# Patient Record
Sex: Male | Born: 1995 | Race: Black or African American | Hispanic: No | Marital: Single | Smoking: Never smoker
Health system: Southern US, Community
[De-identification: ages and names within clinical notes are randomized; demographics above are authoritative.]

## PROBLEM LIST (undated history)

## (undated) DIAGNOSIS — K219 Gastro-esophageal reflux disease without esophagitis: Secondary | ICD-10-CM

## (undated) DIAGNOSIS — J45909 Unspecified asthma, uncomplicated: Secondary | ICD-10-CM

## (undated) DIAGNOSIS — J302 Other seasonal allergic rhinitis: Secondary | ICD-10-CM

## (undated) DIAGNOSIS — K589 Irritable bowel syndrome without diarrhea: Secondary | ICD-10-CM

## (undated) DIAGNOSIS — R197 Diarrhea, unspecified: Secondary | ICD-10-CM

## (undated) DIAGNOSIS — R109 Unspecified abdominal pain: Secondary | ICD-10-CM

## (undated) DIAGNOSIS — R110 Nausea: Secondary | ICD-10-CM

## (undated) DIAGNOSIS — A045 Campylobacter enteritis: Secondary | ICD-10-CM

## (undated) HISTORY — PX: CHOLECYSTECTOMY: SHX55

## (undated) HISTORY — PX: COLONOSCOPY: SHX174

## (undated) HISTORY — DX: Unspecified asthma, uncomplicated: J45.909

## (undated) HISTORY — PX: EYE SURGERY: SHX253

## (undated) HISTORY — DX: Diarrhea, unspecified: R19.7

## (undated) HISTORY — DX: Other seasonal allergic rhinitis: J30.2

## (undated) HISTORY — DX: Irritable bowel syndrome without diarrhea: K58.9

## (undated) HISTORY — DX: Campylobacter enteritis: A04.5

## (undated) HISTORY — PX: TONSILLECTOMY: SUR1361

## (undated) HISTORY — DX: Unspecified abdominal pain: R10.9

## (undated) HISTORY — DX: Gastro-esophageal reflux disease without esophagitis: K21.9

## (undated) HISTORY — DX: Nausea: R11.0

---
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
## 2015-01-14 ENCOUNTER — Telehealth: Payer: Self-pay | Admitting: *Deleted

## 2015-01-14 ENCOUNTER — Ambulatory Visit (INDEPENDENT_AMBULATORY_CARE_PROVIDER_SITE_OTHER): Payer: Medicaid Other | Admitting: Infectious Disease

## 2015-01-14 ENCOUNTER — Encounter: Payer: Self-pay | Admitting: Infectious Disease

## 2015-01-14 VITALS — BP 125/76 | HR 90 | Temp 100.0°F | Ht 68.0 in | Wt 312.0 lb

## 2015-01-14 DIAGNOSIS — A09 Infectious gastroenteritis and colitis, unspecified: Secondary | ICD-10-CM

## 2015-01-14 DIAGNOSIS — A045 Campylobacter enteritis: Secondary | ICD-10-CM | POA: Diagnosis not present

## 2015-01-14 DIAGNOSIS — R109 Unspecified abdominal pain: Secondary | ICD-10-CM

## 2015-01-14 DIAGNOSIS — R110 Nausea: Secondary | ICD-10-CM | POA: Diagnosis not present

## 2015-01-14 DIAGNOSIS — J302 Other seasonal allergic rhinitis: Secondary | ICD-10-CM | POA: Insufficient documentation

## 2015-01-14 DIAGNOSIS — R197 Diarrhea, unspecified: Secondary | ICD-10-CM

## 2015-01-14 HISTORY — DX: Diarrhea, unspecified: R19.7

## 2015-01-14 HISTORY — DX: Nausea: R11.0

## 2015-01-14 HISTORY — DX: Campylobacter enteritis: A04.5

## 2015-01-14 HISTORY — DX: Unspecified abdominal pain: R10.9

## 2015-01-14 LAB — CBC WITH DIFFERENTIAL/PLATELET
BASOS ABS: 0.1 10*3/uL (ref 0.0–0.1)
BASOS PCT: 1 % (ref 0–1)
EOS ABS: 0.1 10*3/uL (ref 0.0–0.7)
EOS PCT: 1 % (ref 0–5)
HCT: 40.1 % (ref 39.0–52.0)
Hemoglobin: 13.4 g/dL (ref 13.0–17.0)
LYMPHS ABS: 2.1 10*3/uL (ref 0.7–4.0)
LYMPHS PCT: 31 % (ref 12–46)
MCH: 24.1 pg — AB (ref 26.0–34.0)
MCHC: 33.4 g/dL (ref 30.0–36.0)
MCV: 72 fL — AB (ref 78.0–100.0)
MPV: 10.4 fL (ref 8.6–12.4)
Monocytes Absolute: 0.7 10*3/uL (ref 0.1–1.0)
Monocytes Relative: 10 % (ref 3–12)
NEUTROS ABS: 3.9 10*3/uL (ref 1.7–7.7)
NEUTROS PCT: 57 % (ref 43–77)
PLATELETS: 349 10*3/uL (ref 150–400)
RBC: 5.57 MIL/uL (ref 4.22–5.81)
RDW: 14.8 % (ref 11.5–15.5)
WBC: 6.9 10*3/uL (ref 4.0–10.5)

## 2015-01-14 LAB — COMPLETE METABOLIC PANEL WITH GFR
ALT: 21 U/L (ref 8–46)
AST: 24 U/L (ref 12–32)
Albumin: 4.4 g/dL (ref 3.6–5.1)
Alkaline Phosphatase: 111 U/L (ref 48–230)
BUN: 6 mg/dL — ABNORMAL LOW (ref 7–20)
CHLORIDE: 101 mmol/L (ref 98–110)
CO2: 26 mmol/L (ref 20–31)
Calcium: 9.6 mg/dL (ref 8.9–10.4)
Creat: 0.84 mg/dL (ref 0.60–1.26)
GLUCOSE: 122 mg/dL — AB (ref 65–99)
Potassium: 3.5 mmol/L — ABNORMAL LOW (ref 3.8–5.1)
SODIUM: 137 mmol/L (ref 135–146)
Total Bilirubin: 0.3 mg/dL (ref 0.2–1.1)
Total Protein: 7.7 g/dL (ref 6.3–8.2)

## 2015-01-14 LAB — C-REACTIVE PROTEIN: CRP: 3.1 mg/dL — ABNORMAL HIGH (ref <0.60)

## 2015-01-14 NOTE — Patient Instructions (Signed)
We will do blood, stool workup and get CT scan of your abdomen  We will set up followup appt in next 3 weeksd

## 2015-01-14 NOTE — Telephone Encounter (Signed)
Patient's mother called regarding a stool test that he brought home to collect. She was previously told it needed to be brought back within one hour. Spoke to Mariea ClontsKaren Green, lab tech and after checking, she stated it would be fine to refrigerate this. Information given to Mom and it will be brought back to our lab tomorrow morning. Wendall MolaJacqueline Cockerham

## 2015-01-14 NOTE — Progress Notes (Signed)
Reason for consult: Persistent diarrhea with concern for persistent or recurrent Campylobacter infection  Requesting physician: Dr Antonio Simmons Subjective:    Patient ID: Antonio Simmons, male    DOB: 14-Sep-1995, 19 y.o.   MRN: [id number]  HPI  This is an 19 year old African-American man who has had a previous history significant for asthma and seasonal allergies who developed diarrhea with abdominal pain cramping and fevers last July 2015. He was treated with antibiotics at that point in time and symptoms improved after 1 week of antibiotics. It seems that he's been suffering from loose stools ever since then but not to the point where he sought medical care. He has been taking a year off however between high school and college in part due to his symptoms. This past July. [place] and 16 he again developed diarrhea loose stools nausea cramping. He was seen by the patient's primary care physician Dr. Eveline Simmons. With symptoms of copious loose stools. No blood in the stools. Again number stools per day were at least 10 times a day with associated cramping of generalized abdominal pain and nausea that has been controllable with Zofran. At that time he was no longer having fever. He was not severing the heartburn or dysphasia. He had a stool study done which was positive for Campylobacter and white blood cells but negative for C. difficile and ova and parasites. He was then treated with azithromycin for 3 days but did not improve and then with erythromycin 5 mg twice daily for 10 days by Dr. Charm Simmons. When seen for follow-up by Dr. Charm Simmons he was still suffering from diarrhea. Repeat stool culture was done and I believe again showed positive Campylobacter. On 12/22/2014. Patient was then placed on ciprofloxacin 500 mg twice daily for 2 weeks. He has had no clear cut improvement in his symptoms since then.  I do not have the stool culture that was recently done it is believed that he has had a recurrence of  Campylobacter concerns that his symptoms for the past year could be due to this infection although this is highly unusual.  Patient has no known immunocompromise. He has not been sexual active that we will test him for HIV today.  I think he may suffer from some underlying irritable bowel syndrome that may be a postinfectious type of syndrome.  It is quite confusing that he still having Campylobacter isolated from stool despite antibiotics that should be efficacious. Certainly for control resistance has been more prevalent but macrolide resistance is fairly rare.   Past Medical History  Diagnosis Date  . Campylobacter enteritis 01/14/2015  . Asthma   . Seasonal allergies   . GERD (gastroesophageal reflux disease)     History reviewed. No pertinent past surgical history.  Family History  Problem Relation Age of Onset  . Liver disease Mother   . Liver disease Father       Social History   Social History  . Marital Status: Single    Spouse Name: N/A  . Number of Children: N/A  . Years of Education: N/A   Social History Main Topics  . Smoking status: Never Smoker   . Smokeless tobacco: None  . Alcohol Use: No  . Drug Use: No  . Sexual Activity: No   Other Topics Concern  . None   Social History Narrative    Allergies  Allergen Reactions  . Ceftin [Cefuroxime Axetil]     No current outpatient prescriptions on file. `  Review of Systems  Constitutional: Negative  for fever, chills, diaphoresis, activity change, appetite change, fatigue and unexpected weight change.  HENT: Negative for congestion, rhinorrhea, sinus pressure, sneezing, sore throat and trouble swallowing.   Eyes: Negative for photophobia and visual disturbance.  Respiratory: Negative for cough, chest tightness, shortness of breath, wheezing and stridor.   Cardiovascular: Negative for chest pain, palpitations and leg swelling.  Gastrointestinal: Positive for nausea, abdominal pain, diarrhea and  abdominal distention. Negative for vomiting, constipation, blood in stool and anal bleeding.  Genitourinary: Negative for dysuria, hematuria, flank pain and difficulty urinating.  Musculoskeletal: Negative for myalgias, back pain, joint swelling, arthralgias and gait problem.  Skin: Negative for color change, pallor, rash and wound.  Neurological: Negative for dizziness, tremors, weakness and light-headedness.  Hematological: Negative for adenopathy. Does not bruise/bleed easily.  Psychiatric/Behavioral: Negative for behavioral problems, confusion, sleep disturbance, dysphoric mood, decreased concentration and agitation.       Objective:   Physical Exam  Constitutional: He is oriented to person, place, and time. He appears well-developed and well-nourished.  HENT:  Head: Normocephalic and atraumatic.  Eyes: Conjunctivae and EOM are normal.  Neck: Normal range of motion. Neck supple.  Cardiovascular: Normal rate and regular rhythm.   Pulmonary/Chest: Effort normal. No respiratory distress. He has no wheezes.  Abdominal: Soft. Bowel sounds are normal. He exhibits no distension. There is tenderness. There is no rebound.  Musculoskeletal: Normal range of motion. He exhibits no edema or tenderness.  Neurological: He is alert and oriented to person, place, and time.  Skin: Skin is warm and dry. No rash noted. No erythema. No pallor.  Psychiatric: He has a normal mood and affect. His behavior is normal. Judgment and thought content normal.          Assessment & Plan:  Persistent diarrhea, nausea, normal cramping fevers: Possibly due to recurrent Campylobacter. Will repeat stool studies today including stool culture C. difficile PCR and toxin assays as well as GI pathogen panel keeping mind that overly sensitive nature of that panel. I will check blood cultures I will check a CBC with Resume tomorrow panel and will pursue a CT with contrast of the abdomen and pelvis.  We will look further  into the literature with regards to cases of recurrent Campylobacter--not a typical scenario.  I agree that colonoscopy may be indicated  STD screening: will check HIV  I spent greater than 60 minutes with the patient including greater than 50% of time in face to face counsel of the patient and father and in coordination of his  care.  Asthma: not active  Acey Lav, MD

## 2015-01-15 ENCOUNTER — Telehealth: Payer: Self-pay | Admitting: Infectious Disease

## 2015-01-15 LAB — HIV ANTIBODY (ROUTINE TESTING W REFLEX): HIV: NONREACTIVE

## 2015-01-15 LAB — SEDIMENTATION RATE: Sed Rate: 20 mm/hr — ABNORMAL HIGH (ref 0–15)

## 2015-01-15 NOTE — Telephone Encounter (Signed)
I called the patient to give him his appointment for his CT abd/pelvis on 10/19 at 12 noon. Patient may have to reschedule, I gave [facility] the [facility] to call and reschedule. Instructions given to him how to drink the contrast

## 2015-01-16 LAB — GASTROINTESTINAL PATHOGEN PANEL PCR
C. difficile Tox A/B, PCR: NEGATIVE
CAMPYLOBACTER, PCR: NEGATIVE
Cryptosporidium, PCR: NEGATIVE
E COLI (STEC) STX1/STX2, PCR: NEGATIVE
E coli (ETEC) LT/ST PCR: NEGATIVE
E coli 0157, PCR: NEGATIVE
GIARDIA LAMBLIA, PCR: NEGATIVE
Norovirus, PCR: NEGATIVE
Rotavirus A, PCR: NEGATIVE
SALMONELLA, PCR: NEGATIVE
Shigella, PCR: NEGATIVE

## 2015-01-16 LAB — CLOSTRIDIUM DIFFICILE BY PCR: CDIFFPCR: NOT DETECTED

## 2015-01-19 ENCOUNTER — Ambulatory Visit (INDEPENDENT_AMBULATORY_CARE_PROVIDER_SITE_OTHER): Payer: Medicaid Other | Admitting: *Deleted

## 2015-01-19 DIAGNOSIS — J309 Allergic rhinitis, unspecified: Secondary | ICD-10-CM | POA: Diagnosis not present

## 2015-01-19 LAB — STOOL CULTURE

## 2015-01-20 LAB — CULTURE, BLOOD (SINGLE)
ORGANISM ID, BACTERIA: NO GROWTH
ORGANISM ID, BACTERIA: NO GROWTH

## 2015-01-20 LAB — TISSUE TRANSGLUTAMINASE, IGG: Tissue Transglut Ab: 1 U/mL (ref <6)

## 2015-01-21 ENCOUNTER — Ambulatory Visit
Admission: RE | Admit: 2015-01-21 | Discharge: 2015-01-21 | Disposition: A | Discharge status: Home / Self Care | Payer: Medicaid Other | Source: Ambulatory Visit | Attending: Infectious Disease | Admitting: Infectious Disease

## 2015-01-21 ENCOUNTER — Other Ambulatory Visit: Payer: Self-pay

## 2015-01-21 DIAGNOSIS — A045 Campylobacter enteritis: Secondary | ICD-10-CM

## 2015-01-21 MED ORDER — IOPAMIDOL (ISOVUE-300) INJECTION 61%
125.0000 mL | Freq: Once | INTRAVENOUS | Status: AC | PRN
  Administered 2015-01-21: 125 mL via INTRAVENOUS

## 2015-01-22 ENCOUNTER — Telehealth: Payer: Self-pay | Admitting: *Deleted

## 2015-01-22 NOTE — Telephone Encounter (Signed)
Mom called for results of CT scan; please advise. Antonio Simmons

## 2015-01-22 NOTE — Telephone Encounter (Signed)
I did NOT find an infectious disease in the patient that I could treat with antibiotics. He has some enlarged lymph nodeson CT scan but that is not specific for anything. I would recommend he be seen again by his gastroenterologist. HE can followup again with us but we have NOT demonstrated evidence of campylobacter or other infectious cause of his symptoms. 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

## 2015-01-23 NOTE — Telephone Encounter (Signed)
Left Mom a detailed voice mail with this information. Advised her to call the clinic with any further questions or concerns and to follow up with GI. Antonio MolaJacqueline Corynne Scibilia

## 2015-01-23 NOTE — Telephone Encounter (Signed)
Mother called back (did not check voicemail), relayed results again.  She will follow up with Dr. Charm BargesButler in [place].  RN forwarded notes/labs per her request. Andree CossHowell, Aleli Navedo M, RN

## 2015-01-23 NOTE — Telephone Encounter (Signed)
Thanks

## 2015-02-04 ENCOUNTER — Ambulatory Visit: Payer: Self-pay | Admitting: Infectious Disease

## 2015-02-09 ENCOUNTER — Ambulatory Visit (INDEPENDENT_AMBULATORY_CARE_PROVIDER_SITE_OTHER): Payer: Medicaid Other

## 2015-02-09 DIAGNOSIS — J309 Allergic rhinitis, unspecified: Secondary | ICD-10-CM

## 2015-03-09 ENCOUNTER — Encounter: Payer: Self-pay | Admitting: Infectious Disease

## 2015-03-09 ENCOUNTER — Ambulatory Visit (INDEPENDENT_AMBULATORY_CARE_PROVIDER_SITE_OTHER): Payer: Medicaid Other | Admitting: *Deleted

## 2015-03-09 ENCOUNTER — Ambulatory Visit (INDEPENDENT_AMBULATORY_CARE_PROVIDER_SITE_OTHER): Payer: Medicaid Other | Admitting: Infectious Disease

## 2015-03-09 VITALS — BP 134/86 | HR 83 | Temp 98.2°F | Ht 68.0 in | Wt 306.0 lb

## 2015-03-09 DIAGNOSIS — A045 Campylobacter enteritis: Secondary | ICD-10-CM | POA: Diagnosis not present

## 2015-03-09 DIAGNOSIS — R109 Unspecified abdominal pain: Secondary | ICD-10-CM

## 2015-03-09 DIAGNOSIS — J309 Allergic rhinitis, unspecified: Secondary | ICD-10-CM | POA: Diagnosis not present

## 2015-03-09 DIAGNOSIS — R110 Nausea: Secondary | ICD-10-CM

## 2015-03-09 DIAGNOSIS — K589 Irritable bowel syndrome without diarrhea: Secondary | ICD-10-CM

## 2015-03-09 HISTORY — DX: Irritable bowel syndrome, unspecified: K58.9

## 2015-03-09 NOTE — Progress Notes (Signed)
Chief complaint:   Subjective:    Patient ID: Antonio Simmons, male    DOB: [birth year], 19 y.o.   MRN: [id number]  HPI   19 year old African-American man who has had a previous history significant for asthma and seasonal allergies who developed diarrhea with abdominal pain cramping and fevers last July 2015. He was treated with antibiotics at that point in time and symptoms improved after 1 week of antibiotics. It seems that he's been suffering from loose stools ever since then but not to the point where he sought medical care. He has been taking a year off however between high school and college in part due to his symptoms. This past July 2016 he again developed diarrhea loose stools nausea cramping. He was seen by the patient's primary care physician Dr. Eveline KetoGrasso. With symptoms of copious loose stools. No blood in the stools. Again number stools per day were at least 10 times a day with associated cramping of generalized abdominal pain and nausea that has been controllable with Zofran. At that time he was no longer having fever. He was not severing the heartburn or dysphasia. He had a stool study done which was positive for Campylobacter and white blood cells but negative for C. difficile and ova and parasites. He was then treated with azithromycin for 3 days but did not improve and then with erythromycin 5 mg twice daily for 10 days by Dr. Charm BargesButler. When seen for follow-up by Dr. Charm BargesButler he was still suffering from diarrhea. Repeat stool culture was done and I believe again showed positive Campylobacter. On 12/22/2014. Patient was then placed on ciprofloxacin 500 mg twice daily for 2 weeks. He has had no clear cut improvement in his symptoms since then.  I did not have the stool culture that was recently done it was believed that he has had a recurrence of Campylobacter concerns that his symptoms for the past year could be due to this infection although this is highly unusual.  Patient has no  known immunocompromise.   At his first visit we tested him for HIV. WE did stool culture and Ova and parasite and GI pathogen panel which were ALL negative.  CT of the abdomen showed some:  "Mildly enlarged mesenteric lymph node seen in right lower quadrant suggesting mesenteric adenitis.:"  He continues to have troubles with alternating constipation and diarrhea with up to 8 BM per day.  He has not lost weight and he has not had fevers.  I tested him with ttG which was negative as well.  Past Medical History  Diagnosis Date  . Campylobacter enteritis 01/14/2015  . Asthma   . Seasonal allergies   . GERD (gastroesophageal reflux disease)   . Nausea without vomiting 01/14/2015  . Diarrhea 01/14/2015  . Abdominal cramping 01/14/2015    No past surgical history on file.  Family History  Problem Relation Age of Onset  . Liver disease Mother   . Liver disease Father       Social History   Social History  . Marital Status: Single    Spouse Name: N/A  . Number of Children: N/A  . Years of Education: N/A   Social History Main Topics  . Smoking status: Never Smoker   . Smokeless tobacco: Not on file  . Alcohol Use: No  . Drug Use: No  . Sexual Activity: No   Other Topics Concern  . Not on file   Social History Narrative    Allergies  Allergen Reactions  .  Ceftin [Cefuroxime Axetil]     No current outpatient prescriptions on file. `  Review of Systems  Constitutional: Negative for fever, chills, diaphoresis, activity change, appetite change, fatigue and unexpected weight change.  HENT: Negative for congestion, rhinorrhea, sinus pressure, sneezing, sore throat and trouble swallowing.   Eyes: Negative for photophobia and visual disturbance.  Respiratory: Negative for cough, chest tightness, shortness of breath, wheezing and stridor.   Cardiovascular: Negative for chest pain, palpitations and leg swelling.  Gastrointestinal: Positive for nausea, abdominal pain,  diarrhea and abdominal distention. Negative for vomiting, constipation, blood in stool and anal bleeding.  Genitourinary: Negative for dysuria, hematuria, flank pain and difficulty urinating.  Musculoskeletal: Negative for myalgias, back pain, joint swelling, arthralgias and gait problem.  Skin: Negative for color change, pallor, rash and wound.  Neurological: Negative for dizziness, tremors, weakness and light-headedness.  Hematological: Negative for adenopathy. Does not bruise/bleed easily.  Psychiatric/Behavioral: Negative for behavioral problems, confusion, sleep disturbance, dysphoric mood, decreased concentration and agitation.       Objective:   Physical Exam  Constitutional: He is oriented to person, place, and time. He appears well-developed and well-nourished.  HENT:  Head: Normocephalic and atraumatic.  Eyes: Conjunctivae and EOM are normal.  Neck: Normal range of motion. Neck supple.  Cardiovascular: Normal rate and regular rhythm.   Pulmonary/Chest: Effort normal. No respiratory distress. He has no wheezes.  Abdominal: Soft. Bowel sounds are normal. He exhibits no distension. There is tenderness. There is no rebound.  Musculoskeletal: Normal range of motion. He exhibits no edema or tenderness.  Neurological: He is alert and oriented to person, place, and time.  Skin: Skin is warm and dry. No rash noted. No erythema. No pallor.  Psychiatric: He has a normal mood and affect. His behavior is normal. Judgment and thought content normal.          Assessment & Plan:  Persistent diarrhea, nausea, normal cramping fevers:  I could NOT find ANY evidence of ACTIVE GI infection by GI pathogen panel, stool studies, CT abdomen and pelvis. 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  He is currently taking alternating laxatives and anti motility agents per his GI MD in [place].  I will refer him to Dr. Leone Payor  here locally and also recommended he consider being seen by Dr. Almyra Deforest in [place]--note no longer part of [facility] and is in his own practice.   STD screening:  HIV negative    Acey Lav, MD

## 2015-04-02 ENCOUNTER — Ambulatory Visit (INDEPENDENT_AMBULATORY_CARE_PROVIDER_SITE_OTHER): Payer: Medicaid Other | Admitting: *Deleted

## 2015-04-02 DIAGNOSIS — J309 Allergic rhinitis, unspecified: Secondary | ICD-10-CM

## 2015-04-27 ENCOUNTER — Ambulatory Visit (INDEPENDENT_AMBULATORY_CARE_PROVIDER_SITE_OTHER): Payer: PRIVATE HEALTH INSURANCE | Admitting: *Deleted

## 2015-04-27 DIAGNOSIS — J309 Allergic rhinitis, unspecified: Secondary | ICD-10-CM

## 2015-05-11 ENCOUNTER — Ambulatory Visit (INDEPENDENT_AMBULATORY_CARE_PROVIDER_SITE_OTHER): Payer: PRIVATE HEALTH INSURANCE | Admitting: *Deleted

## 2015-05-11 DIAGNOSIS — J309 Allergic rhinitis, unspecified: Secondary | ICD-10-CM | POA: Diagnosis not present

## 2015-05-25 ENCOUNTER — Ambulatory Visit (INDEPENDENT_AMBULATORY_CARE_PROVIDER_SITE_OTHER): Payer: PRIVATE HEALTH INSURANCE

## 2015-05-25 DIAGNOSIS — J309 Allergic rhinitis, unspecified: Secondary | ICD-10-CM

## 2015-06-15 ENCOUNTER — Ambulatory Visit (INDEPENDENT_AMBULATORY_CARE_PROVIDER_SITE_OTHER): Payer: PRIVATE HEALTH INSURANCE | Admitting: *Deleted

## 2015-06-15 DIAGNOSIS — J309 Allergic rhinitis, unspecified: Secondary | ICD-10-CM

## 2015-06-29 ENCOUNTER — Ambulatory Visit (INDEPENDENT_AMBULATORY_CARE_PROVIDER_SITE_OTHER): Payer: PRIVATE HEALTH INSURANCE

## 2015-06-29 DIAGNOSIS — J309 Allergic rhinitis, unspecified: Secondary | ICD-10-CM | POA: Diagnosis not present

## 2015-07-13 ENCOUNTER — Ambulatory Visit (INDEPENDENT_AMBULATORY_CARE_PROVIDER_SITE_OTHER): Payer: PRIVATE HEALTH INSURANCE

## 2015-07-13 DIAGNOSIS — J309 Allergic rhinitis, unspecified: Secondary | ICD-10-CM

## 2015-07-21 DIAGNOSIS — J3089 Other allergic rhinitis: Secondary | ICD-10-CM | POA: Diagnosis not present

## 2015-07-30 ENCOUNTER — Ambulatory Visit (INDEPENDENT_AMBULATORY_CARE_PROVIDER_SITE_OTHER): Payer: PRIVATE HEALTH INSURANCE | Admitting: *Deleted

## 2015-07-30 DIAGNOSIS — J309 Allergic rhinitis, unspecified: Secondary | ICD-10-CM | POA: Diagnosis not present

## 2015-08-24 ENCOUNTER — Ambulatory Visit (INDEPENDENT_AMBULATORY_CARE_PROVIDER_SITE_OTHER): Payer: PRIVATE HEALTH INSURANCE

## 2015-08-24 DIAGNOSIS — J309 Allergic rhinitis, unspecified: Secondary | ICD-10-CM

## 2015-09-17 ENCOUNTER — Ambulatory Visit: Payer: Self-pay

## 2015-09-17 ENCOUNTER — Encounter: Payer: Self-pay | Admitting: Allergy and Immunology

## 2015-09-17 ENCOUNTER — Ambulatory Visit (INDEPENDENT_AMBULATORY_CARE_PROVIDER_SITE_OTHER): Payer: PRIVATE HEALTH INSURANCE | Admitting: Allergy and Immunology

## 2015-09-17 VITALS — BP 110/78 | HR 60 | Resp 16 | Ht 68.31 in | Wt 209.4 lb

## 2015-09-17 DIAGNOSIS — H1010 Acute atopic conjunctivitis, unspecified eye: Secondary | ICD-10-CM | POA: Diagnosis not present

## 2015-09-17 DIAGNOSIS — Z91018 Allergy to other foods: Secondary | ICD-10-CM

## 2015-09-17 DIAGNOSIS — J4520 Mild intermittent asthma, uncomplicated: Secondary | ICD-10-CM

## 2015-09-17 DIAGNOSIS — J309 Allergic rhinitis, unspecified: Secondary | ICD-10-CM

## 2015-09-17 NOTE — Patient Instructions (Addendum)
  1. Continue Zyrtec 10 mg tablet 1 time per day if needed  2. Continue ProAir HFA 2 puffs every 4-6 hours if needed  3. Continue omeprazole 40 mg tablet 1 time per day if needed  4. Blood tests - tree nut panel, peanut components  5. EpiPen?, Challenge?  6. Obtain annual fall flu vaccine every year  7. Stop immunotherapy  8. Return to clinic in 1 year or earlier if problem

## 2015-09-17 NOTE — Progress Notes (Signed)
Follow-up Note  Referring Provider: Gordan PaymentGrisso, Greg A., MD Primary Provider: Feliciana RossettiGRISSO,GREG, MD Date of Office Visit: 09/17/2015  Subjective:   Antonio Simmons (DOB: April 01, 1996) is a 20 y.o. male [facility] in re-evaluation of the following:  HPI: Antonio Simmons to this clinic in reevaluation of his asthma, allergic rhinitis, history of nut allergy, and LPR. I've not seen him in his clinic in approximately 2 years. He's been undergoing a course of immunotherapy for at least 5 years.  Antonio Simmons has basically resolved the bulk of his atopic respiratory disease. He has not required a systemic steroid in greater than a year to treat this condition. He does not use a short acting bronchodilator and can exercise without any problem without the use of any controller agent. He has stopped his Dulera and has stopped his montelukast.  Likewise, he's had very little problems with his upper airways or eyes. He occasionally uses cetirizine but no nasal steroid.  His reflux has basically melted away and he rarely uses a omeprazole.  He still remains away from the consumption of all peanuts and tree nuts.    Medication List           cetirizine 10 MG tablet  Commonly known as:  ZYRTEC  Take 10 mg by mouth daily.     EPIPEN 2-PAK 0.3 mg/0.3 mL Soaj injection  Generic drug:  EPINEPHrine  Inject 0.3 mg into the muscle once.     NASONEX 50 MCG/ACT nasal spray  Generic drug:  mometasone  Place 1 spray into the nose daily.     omeprazole 40 MG capsule  Commonly known as:  PRILOSEC  Take 40 mg by mouth daily.     PROAIR HFA 108 (90 Base) MCG/ACT inhaler  Generic drug:  albuterol  Inhale 2 puffs into the lungs every 4 (four) hours as needed for wheezing or shortness of breath.        Past Medical History  Diagnosis Date  . Campylobacter enteritis 01/14/2015  . Asthma   . Seasonal allergies   . GERD (gastroesophageal reflux  disease)   . Nausea without vomiting 01/14/2015  . Diarrhea 01/14/2015  . Abdominal cramping 01/14/2015  . IBS (irritable bowel syndrome) 03/09/2015    Past Surgical History  Procedure Laterality Date  . Tonsillectomy      Allergies  Allergen Reactions  . Ceftin [Cefuroxime Axetil] Other (See Comments)    Hallucinations      Review of systems negative except as noted in HPI / PMHx or noted below:  Review of Systems  Constitutional: Negative.   HENT: Negative.   Eyes: Negative.   Respiratory: Negative.   Cardiovascular: Negative.   Gastrointestinal: Negative.   Genitourinary: Negative.   Musculoskeletal: Negative.   Skin: Negative.   Neurological: Negative.   Endo/Heme/Allergies: Negative.   Psychiatric/Behavioral: Negative.      Objective:   Filed Vitals:   09/17/15 0927  BP: 110/78  Pulse: 60  Resp: 16   Height: 5' 8.31" (173.5 cm)  Weight: 209 lb 6.4 oz (94.983 kg)   Physical Exam  Constitutional: He is well-developed, well-nourished, and in no distress.  HENT:  Head: Normocephalic.  Right Ear: Tympanic membrane, external ear and ear canal normal.  Left Ear: Tympanic membrane, external ear and ear canal normal.  Nose: Nose normal. No mucosal edema or rhinorrhea.  Mouth/Throat: Uvula is midline, oropharynx is clear and moist and mucous membranes are normal. No oropharyngeal  exudate.  Eyes: Conjunctivae are normal.  Neck: Trachea normal. No tracheal tenderness present. No tracheal deviation present. No thyromegaly present.  Cardiovascular: Normal rate, regular rhythm, S1 normal, S2 normal and normal heart sounds.   No murmur heard. Pulmonary/Chest: Breath sounds normal. No stridor. No respiratory distress. He has no wheezes. He has no rales.  Musculoskeletal: He exhibits no edema.  Lymphadenopathy:       Head (right side): No tonsillar adenopathy present.       Head (left side): No tonsillar adenopathy present.    He has no cervical adenopathy.    Neurological: He is alert. Gait normal.  Skin: No rash noted. He is not diaphoretic. No erythema. Nails show no clubbing.  Psychiatric: Mood and affect normal.    Diagnostics: Allergy skin tests were performed. He did not demonstrate any hypersensitivity against peanut or tree nuts   Spirometry was performed and demonstrated an FEV1 of 4.19 at 113 % of predicted.  The patient had an Asthma Control Test with the following results: ACT Total Score: 24.    Assessment and Plan:   1. Asthma, mild intermittent, well-controlled   2. Allergic rhinoconjunctivitis   3. Food allergy     1. Continue Zyrtec 10 mg tablet 1 time per day if needed  2. Continue ProAir HFA 2 puffs every 4-6 hours if needed  3. Continue omeprazole 40 mg tablet 1 time per day if needed  4. Blood tests - tree nut panel, peanut components  5. EpiPen?, Challenge?  6. Obtain annual fall flu vaccine every year  7. Stop immunotherapy  8. Return to clinic in 1 year or earlier if problem  Antonio Simmons has really done quite well with his immunotherapy and I think we now need to see if he is a long-term responder to this treatment by discontinuing this form of therapy. I would like to work through his food allergies in a little more detail by checking for IgE specific antibodies to see if he is a candidate for an in clinic challenge directed against specific food products. I will see him back in this clinic in 1 year or earlier if there is a problem. I will contact him with the results of his blood test to see if we can arrange an in clinic challenge.  Laurette Schimke, MD [facility]

## 2015-09-20 LAB — ALLERGENS(7)
Brazil Nut IgE: <0.1 kU/L
F202-IgE Cashew Nut: <0.1 kU/L
Walnut IgE: <0.1 kU/L

## 2015-09-20 LAB — IGE PEANUT COMPONENT PROFILE
F352-IgE Ara h 8: <0.1 kU/L
F422-IgE Ara h 1: <0.1 kU/L
F423-IgE Ara h 2: <0.1 kU/L
F424-IgE Ara h 3: <0.1 kU/L

## 2015-10-15 ENCOUNTER — Ambulatory Visit: Payer: PRIVATE HEALTH INSURANCE | Admitting: Allergy and Immunology

## 2015-11-02 ENCOUNTER — Ambulatory Visit: Payer: PRIVATE HEALTH INSURANCE | Admitting: Allergy and Immunology

## 2016-02-15 IMAGING — CT CT ABD-PELV W/ CM
2 of 4 series · 17 of 46 positions shown, 19 images · IV contrast (iopamidol)
Comparison: None.

CLINICAL DATA: Nausea, diarrhea, lower abdominal pain.

EXAM:
CT ABDOMEN AND PELVIS WITH CONTRAST
TECHNIQUE: Multidetector CT imaging of the abdomen and pelvis was performed
using the standard protocol following bolus administration of
intravenous contrast.
CONTRAST:  125mL O6DAZA-UAA IOPAMIDOL (O6DAZA-UAA) INJECTION 61%

[Series 2: abd/pelvis w/cm · axial · 0.95mm/px · z∈[-535,-65]mm · 14 of 104 slices shown, 16 images]
[im 5/104  soft-tissue]
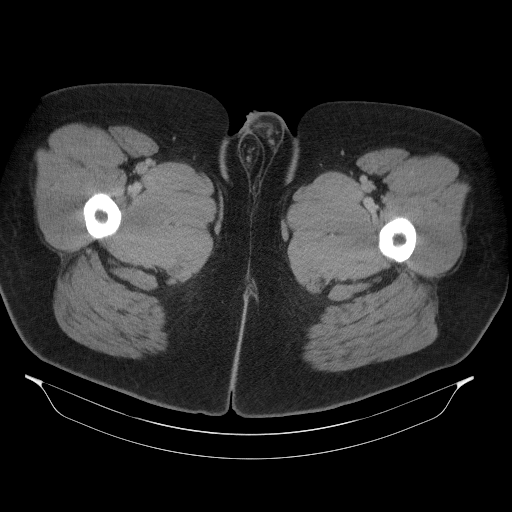
[im 5/104  bone]
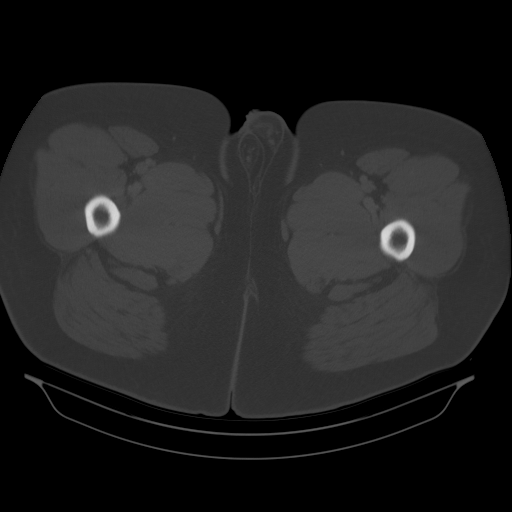
[im 13/104  soft-tissue]
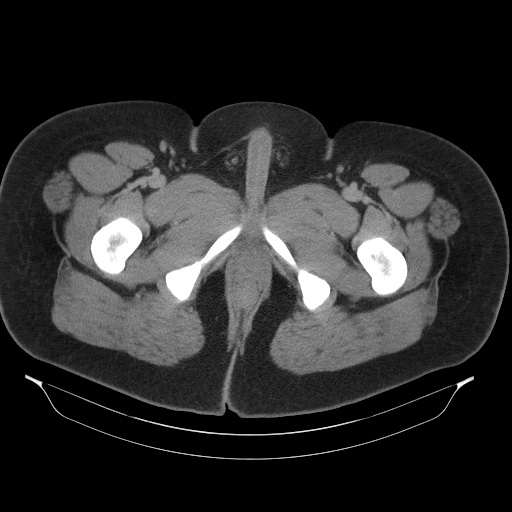
[im 21/104  soft-tissue]
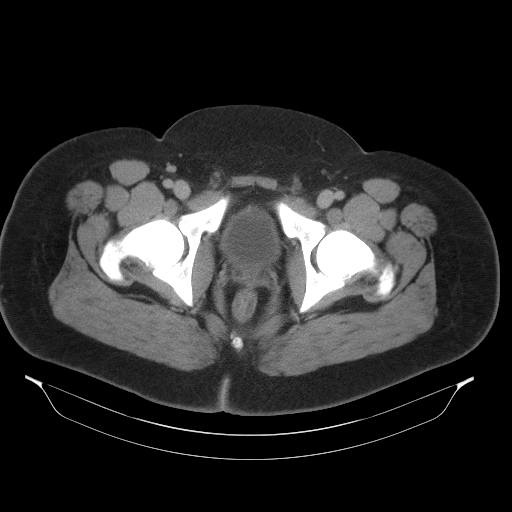
[im 29/104  soft-tissue]
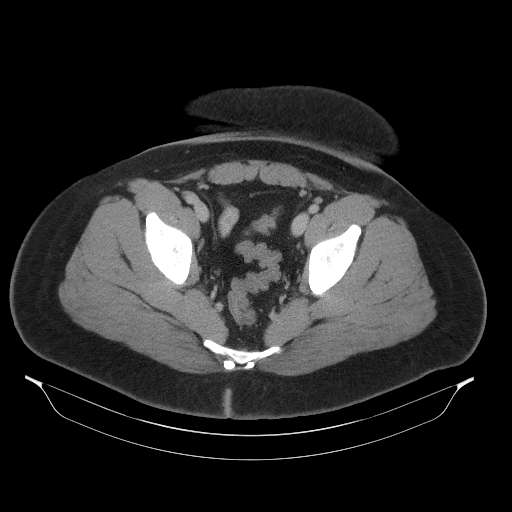
[im 33/104  soft-tissue]
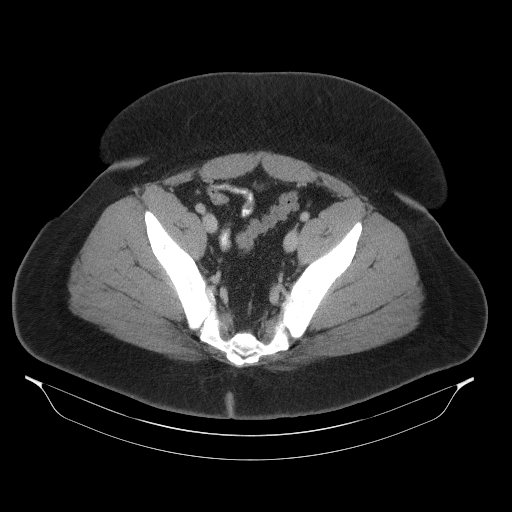
[im 42/104  soft-tissue]
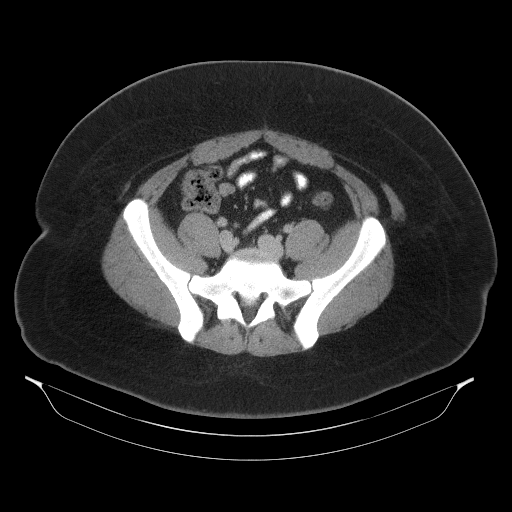
[im 50/104  soft-tissue]
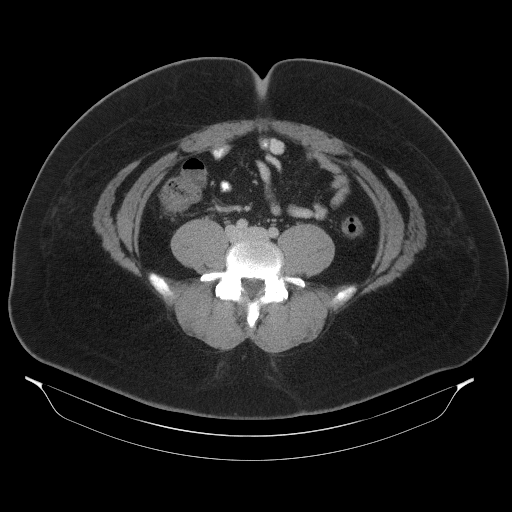
[im 54/104  soft-tissue]
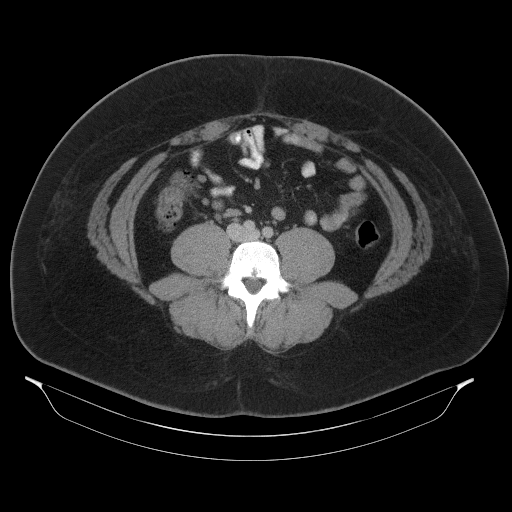
[im 62/104  soft-tissue]
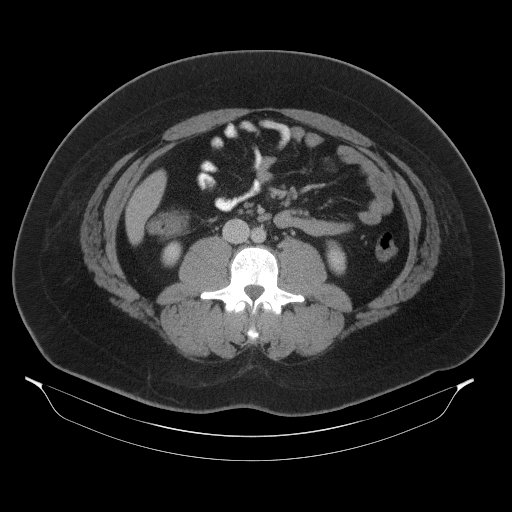
[im 62/104  bone]
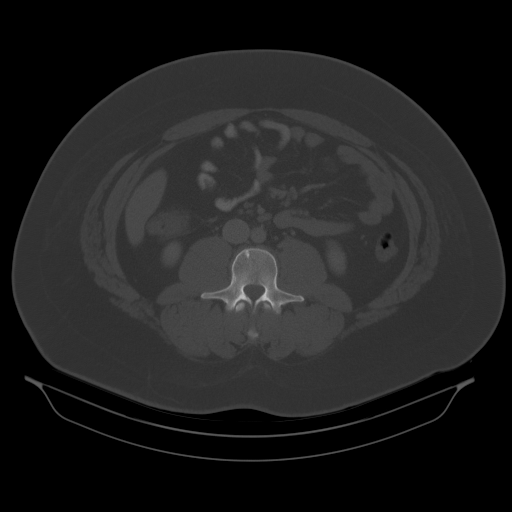
[im 71/104  soft-tissue]
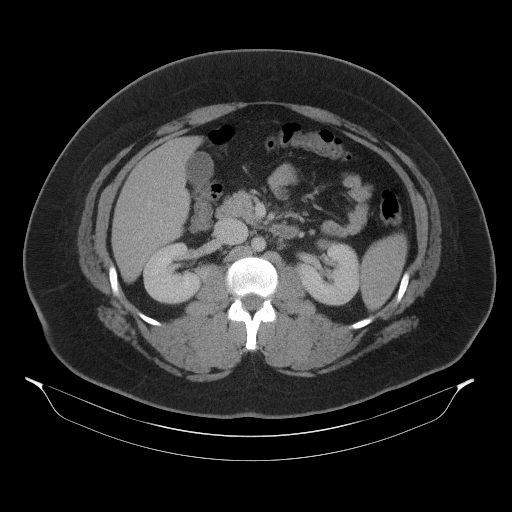
[im 79/104  soft-tissue]
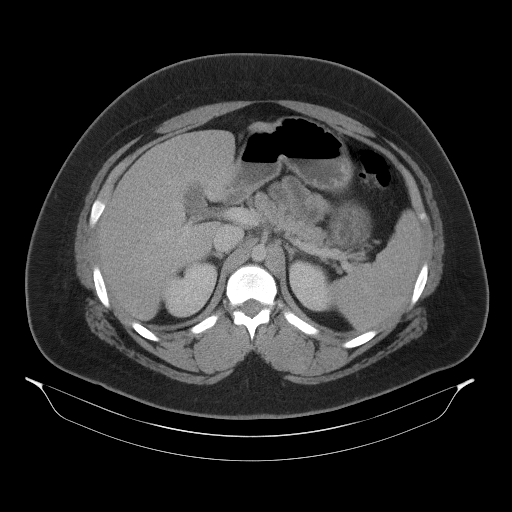
[im 83/104  soft-tissue]
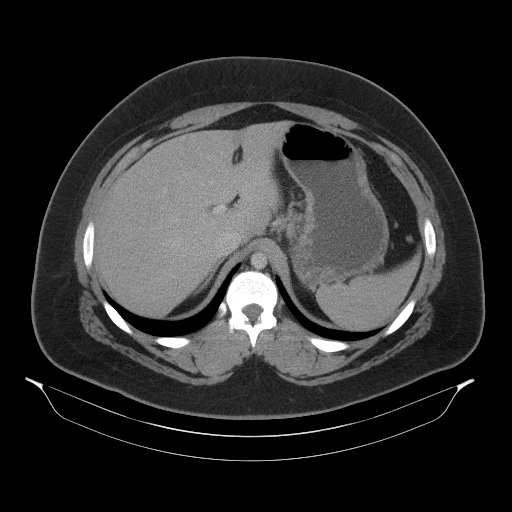
[im 91/104  soft-tissue]
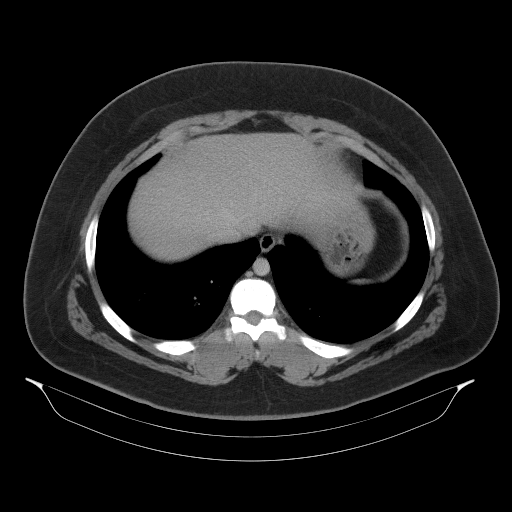
[im 99/104  soft-tissue]
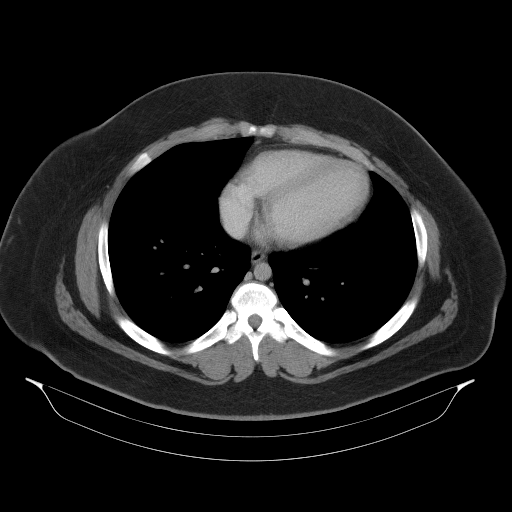

[Series 3: cor · coronal · 0.93mm/px · 3 of 111 slices shown]
[im 37/111  soft-tissue]
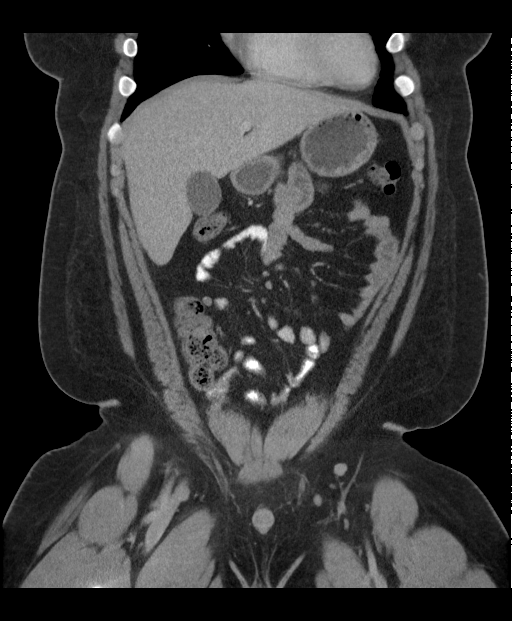
[im 49/111  soft-tissue]
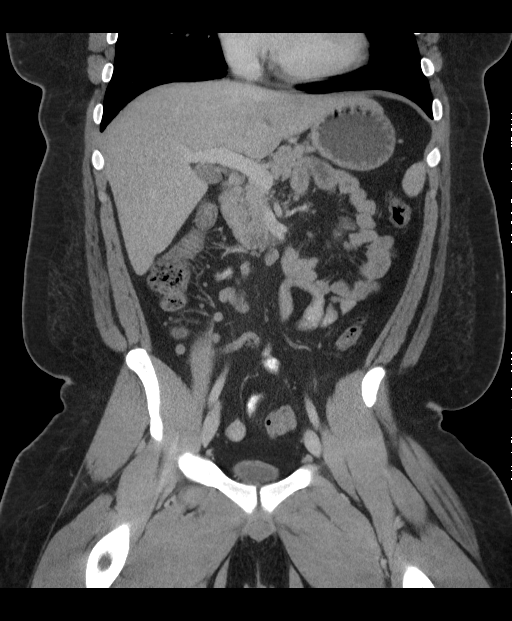
[im 62/111  soft-tissue]
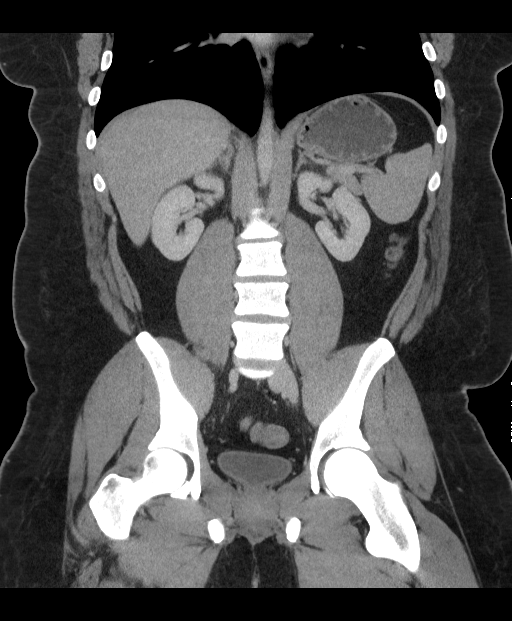

[17 of 46 positions shown; findings below may reference images not displayed]

FINDINGS: Visualized lung bases are unremarkable. No significant osseous
abnormality is noted.

No gallstones are noted. The liver, spleen and pancreas appear
normal. Adrenal glands and kidneys appear normal. No hydronephrosis
or renal obstruction is noted. No renal or ureteral calculi are
noted. The appendix appears normal. There is no evidence of bowel
obstruction. No abnormal fluid collection is noted. Urinary bladder
appears normal. Mildly enlarged mesenteric lymph nodes are noted in
right lower quadrant suggesting mesenteric adenitis.
IMPRESSION: Mildly enlarged mesenteric lymph node seen in right lower quadrant
suggesting mesenteric adenitis. No other abnormality seen in the
abdomen or pelvis.

## 2016-04-04 DIAGNOSIS — F209 Schizophrenia, unspecified: Secondary | ICD-10-CM

## 2016-04-04 HISTORY — DX: Schizophrenia, unspecified: F20.9

## 2021-03-20 ENCOUNTER — Emergency Department (HOSPITAL_BASED_OUTPATIENT_CLINIC_OR_DEPARTMENT_OTHER)
Admission: EM | Admit: 2021-03-20 | Discharge: 2021-03-20 | Disposition: A | Discharge status: Home / Self Care | Payer: BLUE CROSS/BLUE SHIELD | Attending: Emergency Medicine | Admitting: Emergency Medicine

## 2021-03-20 ENCOUNTER — Encounter (HOSPITAL_BASED_OUTPATIENT_CLINIC_OR_DEPARTMENT_OTHER): Payer: Self-pay | Admitting: Emergency Medicine

## 2021-03-20 ENCOUNTER — Other Ambulatory Visit: Payer: Self-pay

## 2021-03-20 DIAGNOSIS — R1010 Upper abdominal pain, unspecified: Secondary | ICD-10-CM | POA: Diagnosis present

## 2021-03-20 DIAGNOSIS — J4540 Moderate persistent asthma, uncomplicated: Secondary | ICD-10-CM | POA: Insufficient documentation

## 2021-03-20 DIAGNOSIS — K2920 Alcoholic gastritis without bleeding: Secondary | ICD-10-CM | POA: Diagnosis not present

## 2021-03-20 DIAGNOSIS — K2900 Acute gastritis without bleeding: Secondary | ICD-10-CM

## 2021-03-20 LAB — CBC WITH DIFFERENTIAL/PLATELET
Abs Immature Granulocytes: 0 10*3/uL (ref 0.00–0.07)
Basophils Absolute: 0.1 10*3/uL (ref 0.0–0.1)
Basophils Relative: 1 %
Eosinophils Absolute: 0.4 10*3/uL (ref 0.0–0.5)
Eosinophils Relative: 7 %
HCT: 44.7 % (ref 39.0–52.0)
Hemoglobin: 14.9 g/dL (ref 13.0–17.0)
Immature Granulocytes: 0 %
Lymphocytes Relative: 29 %
Lymphs Abs: 1.6 10*3/uL (ref 0.7–4.0)
MCH: 27.7 pg (ref 26.0–34.0)
MCHC: 33.3 g/dL (ref 30.0–36.0)
MCV: 83.2 fL (ref 80.0–100.0)
Monocytes Absolute: 0.4 10*3/uL (ref 0.1–1.0)
Monocytes Relative: 8 %
Neutro Abs: 3.1 10*3/uL (ref 1.7–7.7)
Neutrophils Relative %: 55 %
Platelets: 282 10*3/uL (ref 150–400)
RBC: 5.37 MIL/uL (ref 4.22–5.81)
RDW: 13.2 % (ref 11.5–15.5)
WBC: 5.5 10*3/uL (ref 4.0–10.5)
nRBC: 0 % (ref 0.0–0.2)

## 2021-03-20 LAB — URINALYSIS, ROUTINE W REFLEX MICROSCOPIC
Bilirubin Urine: NEGATIVE
Glucose, UA: NEGATIVE mg/dL
Hgb urine dipstick: NEGATIVE
Ketones, ur: NEGATIVE mg/dL
Leukocytes,Ua: NEGATIVE
Nitrite: NEGATIVE
Protein, ur: NEGATIVE mg/dL
Specific Gravity, Urine: 1.015 (ref 1.005–1.030)
pH: 7.5 (ref 5.0–8.0)

## 2021-03-20 LAB — COMPREHENSIVE METABOLIC PANEL
ALT: 13 U/L (ref 0–44)
AST: 23 U/L (ref 15–41)
Albumin: 4.2 g/dL (ref 3.5–5.0)
Alkaline Phosphatase: 83 U/L (ref 38–126)
Anion gap: 7 (ref 5–15)
BUN: 7 mg/dL (ref 6–20)
CO2: 27 mmol/L (ref 22–32)
Calcium: 8.9 mg/dL (ref 8.9–10.3)
Chloride: 103 mmol/L (ref 98–111)
Creatinine, Ser: 0.85 mg/dL (ref 0.61–1.24)
GFR, Estimated: >60 mL/min (ref >60)
Glucose, Bld: 89 mg/dL (ref 70–99)
Potassium: 3.6 mmol/L (ref 3.5–5.1)
Sodium: 137 mmol/L (ref 135–145)
Total Bilirubin: 0.7 mg/dL (ref 0.3–1.2)
Total Protein: 7.5 g/dL (ref 6.5–8.1)

## 2021-03-20 LAB — LIPASE, BLOOD: Lipase: 28 U/L (ref 11–51)

## 2021-03-20 MED ORDER — SUCRALFATE 1 G PO TABS: 1.0000 g | ORAL_TABLET | Freq: Three times a day (TID) | ORAL | 0 refills | Status: AC

## 2021-03-20 MED ORDER — ALUM & MAG HYDROXIDE-SIMETH 200-200-20 MG/5ML PO SUSP
30.0000 mL | Freq: Once | ORAL | Status: AC
  Administered 2021-03-20: 30 mL via ORAL
  Filled 2021-03-20: qty 30

## 2021-03-20 MED ORDER — LIDOCAINE VISCOUS HCL 2 % MT SOLN
15.0000 mL | Freq: Once | OROMUCOSAL | Status: AC
  Administered 2021-03-20: 15 mL via ORAL
  Filled 2021-03-20: qty 15

## 2021-03-20 MED ORDER — PANTOPRAZOLE SODIUM 40 MG IV SOLR
40.0000 mg | Freq: Once | INTRAVENOUS | Status: AC
  Administered 2021-03-20: 40 mg via INTRAVENOUS
  Filled 2021-03-20: qty 40

## 2021-03-20 MED ORDER — FAMOTIDINE 20 MG PO TABS: 20.0000 mg | ORAL_TABLET | Freq: Two times a day (BID) | ORAL | 0 refills | Status: AC

## 2021-03-20 MED ORDER — OMEPRAZOLE 20 MG PO CPDR: 20.0000 mg | DELAYED_RELEASE_CAPSULE | Freq: Every day | ORAL | 0 refills | Status: AC

## 2021-03-20 NOTE — Discharge Instructions (Signed)
Please read and follow all provided instructions.  Your diagnoses today include:  1. Acute gastritis without hemorrhage, unspecified gastritis type     Tests performed today include: Blood cell counts and platelets Kidney and liver function tests Pancreas function test (called lipase) Urine test to look for infection Vital signs. See below for your results today.   Medications prescribed:  Omeprazole (Prilosec) - stomach acid reducer  This medication can be found over-the-counter  Pepcid (famotidine) - antihistamine  You can find this medication over-the-counter.   DO NOT exceed:  20mg  Pepcid every 12 hours  Carafate - for stomach upset and to protect your stomach  Take any prescribed medications only as directed.  Home care instructions:  Follow any educational materials contained in this packet.  Follow-up instructions: Please follow-up with your primary care provider in the next 3 days for further evaluation of your symptoms.    Return instructions:  SEEK IMMEDIATE MEDICAL ATTENTION IF: The pain does not go away or becomes severe  A temperature above 101F develops  Repeated vomiting occurs (multiple episodes)  The pain becomes localized to portions of the abdomen. The right side could possibly be appendicitis. In an adult, the left lower portion of the abdomen could be colitis or diverticulitis.  Blood is being passed in stools or vomit (bright red or black tarry stools)  You develop chest pain, difficulty breathing, dizziness or fainting, or become confused, poorly responsive, or inconsolable (young children) If you have any other emergent concerns regarding your health  Additional Information: Abdominal (belly) pain can be caused by many things. Your caregiver performed an examination and possibly ordered blood/urine tests and imaging (CT scan, x-rays, ultrasound). Many cases can be observed and treated at home after initial evaluation in the emergency department.  Even though you are being discharged home, abdominal pain can be unpredictable. Therefore, you need a repeated exam if your pain does not resolve, returns, or worsens. Most patients with abdominal pain don't have to be admitted to the hospital or have surgery, but serious problems like appendicitis and gallbladder attacks can start out as nonspecific pain. Many abdominal conditions cannot be diagnosed in one visit, so follow-up evaluations are very important.  Your vital signs today were: BP (!) 109/57 (BP Location: Right Arm)    Pulse (!) 57    Temp 98.3 F (36.8 C) (Oral)    Resp 18    Ht 5\' 10"  (1.778 m)    Wt 108.9 kg    SpO2 98%    BMI 34.44 kg/m  If your blood pressure (bp) was elevated above 135/85 this visit, please have this repeated by your doctor within one month. --------------

## 2021-03-20 NOTE — ED Triage Notes (Signed)
Pt c/o abdominal pain with nausea and hot flashes x 2 weeks. Pt has history of IBS.

## 2021-03-20 NOTE — ED Provider Notes (Signed)
[facility] EMERGENCY DEPARTMENT Provider Note   CSN: [id number] Arrival date & time: 03/20/21  1114     History Chief Complaint  Patient presents with   Abdominal Pain    Antonio Simmons is a 25 y.o. male.  Patient presents to the emergency department for evaluation of abdominal tenderness.  Patient has a history of IBS.  He states that he has been having worsening symptoms including upper abdominal pain, worse with eating, over the past 2 weeks with associated nausea.  He states that symptoms have been acutely worse for 2 days prompting emergency department visit today.  No persistent diarrhea.  No vomiting.  No fevers, chest pain or shortness of breath.  Symptoms are similar to previous episodes of gastritis.  He denies heavy NSAID or alcohol use.  Does drink occasionally.  He has not currently taking any acid blocking medications.       Past Medical History:  Diagnosis Date   Abdominal cramping 01/14/2015   Asthma    Campylobacter enteritis 01/14/2015   Diarrhea 01/14/2015   GERD (gastroesophageal reflux disease)    IBS (irritable bowel syndrome) 03/09/2015   Nausea without vomiting 01/14/2015   Seasonal allergies     Patient Active Problem List   Diagnosis Date Noted   IBS (irritable bowel syndrome) 03/09/2015   Campylobacter enteritis 01/14/2015   Nausea without vomiting 01/14/2015   Diarrhea 01/14/2015   Abdominal cramping 01/14/2015   Seasonal allergies    Sinusitis 11/25/2014   Moderate persistent asthma 11/25/2014   Allergic rhinitis 11/25/2014   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    Past Surgical History:  Procedure Laterality Date   CHOLECYSTECTOMY     COLONOSCOPY     EYE SURGERY     cysts   TONSILLECTOMY         Family History  Problem Relation Age of Onset   Liver disease Mother    Diabetes Mother    Liver disease Father    Diabetes Father    Diabetes Sister    Allergic  rhinitis Sister    Asthma Sister     Social History   Tobacco Use   Smoking status: Never   Smokeless tobacco: Never  Vaping Use   Vaping Use: Every day   Substances: Nicotine  Substance Use Topics   Alcohol use: Yes    Comment: rare   Drug use: Yes    Types: Marijuana    Home Medications Prior to Admission medications   Medication Sig Start Date End Date Taking? Authorizing Provider  albuterol (PROAIR HFA) 108 (90 Base) MCG/ACT inhaler Inhale 2 puffs into the lungs every 4 (four) hours as needed for wheezing or shortness of breath.    [provider]  cetirizine (ZYRTEC) 10 MG tablet Take 10 mg by mouth daily.    [provider]  EPINEPHrine (EPIPEN 2-PAK) 0.3 mg/0.3 mL IJ SOAJ injection Inject 0.3 mg into the muscle once.    [provider]  mometasone (NASONEX) 50 MCG/ACT nasal spray Place 1 spray into the nose daily.    [provider]  mometasone-formoterol (DULERA) 200-5 MCG/ACT AERO Inhale 2 puffs into the lungs 2 (two) times daily as needed for wheezing.    [provider]  omeprazole (PRILOSEC) 40 MG capsule Take 40 mg by mouth daily.    [provider]    Allergies    Ceftin [cefuroxime axetil]  Review of Systems   Review of Systems  Constitutional:  Negative for fever.  HENT:  Negative for rhinorrhea and sore throat.   Eyes:  Negative for redness.  Respiratory:  Negative for cough.   Cardiovascular:  Negative for chest pain.  Gastrointestinal:  Positive for abdominal pain and nausea. Negative for blood in stool, constipation, diarrhea and vomiting.  Genitourinary:  Negative for dysuria and hematuria.  Musculoskeletal:  Negative for myalgias.  Skin:  Negative for rash.  Neurological:  Negative for headaches.   Physical Exam Updated Vital Signs BP 119/71 (BP Location: Left Arm)    Pulse 68    Temp 98.3 F (36.8 C) (Oral)    Resp 16    Ht 5\' 10"  (1.778 m)    Wt 108.9 kg    SpO2 98%    BMI 34.44 kg/m    Physical Exam Vitals and nursing note reviewed.  Constitutional:      General: He is not in acute distress.    Appearance: He is well-developed.  HENT:     Head: Normocephalic and atraumatic.  Eyes:     General:        Right eye: No discharge.        Left eye: No discharge.     Conjunctiva/sclera: Conjunctivae normal.  Cardiovascular:     Rate and Rhythm: Normal rate and regular rhythm.     Heart sounds: Normal heart sounds.  Pulmonary:     Effort: Pulmonary effort is normal.     Breath sounds: Normal breath sounds.  Abdominal:     Palpations: Abdomen is soft.     Tenderness: There is abdominal tenderness (Mild) in the right upper quadrant, epigastric area, periumbilical area and left upper quadrant. There is no guarding or rebound. Negative signs include Murphy's sign and McBurney's sign.  Musculoskeletal:     Cervical back: Normal range of motion and neck supple.  Skin:    General: Skin is warm and dry.  Neurological:     Mental Status: He is alert.    ED Results / Procedures / Treatments   Labs (all labs ordered are listed, but only abnormal results are displayed) Labs Reviewed  CBC WITH DIFFERENTIAL/PLATELET  COMPREHENSIVE METABOLIC PANEL  LIPASE, BLOOD  URINALYSIS, ROUTINE W REFLEX MICROSCOPIC    EKG None  Radiology No results found.  Procedures Procedures   Medications Ordered in ED Medications  alum & mag hydroxide-simeth (MAALOX/MYLANTA) 200-200-20 MG/5ML suspension 30 mL (has no administration in time range)    And  lidocaine (XYLOCAINE) 2 % viscous mouth solution 15 mL (has no administration in time range)  pantoprazole (PROTONIX) injection 40 mg (has no administration in time range)    ED Course  I have reviewed the triage vital signs and the nursing notes.  Pertinent labs & imaging results that were available during my care of the patient were reviewed by me and considered in my medical decision making (see chart for details).  Patient seen  and examined. Plan discussed with patient.   Labs: CBC, CMP, lipase, UA  Imaging: Not felt indicated at this point unless abnormalities noted.  Medications/Fluids: GI cocktail, IV Protonix  Vital signs reviewed and are as follows: BP 119/71 (BP Location: Left Arm)    Pulse 68    Temp 98.3 F (36.8 C) (Oral)    Resp 16    Ht 5\' 10"  (1.778 m)    Wt 108.9 kg    SpO2 98%    BMI 34.44 kg/m   Initial impression: Upper abdominal pain, suspect gastritis.  Cannot rule out peptic ulcer disease, GERD, IBS.  Abdominal exam is nonfocal without signs of peritonitis.  1:49 PM On re-exam patient appears comfortable, abdomen is soft and nontender.  Discussed pertinent results with patient at bedside. This included normal lab work-up. They are comfortable with discharge at this time.   Home treatment: Prescription written for omeprazole, Pepcid, Carafate. Counseled to use tylenol and ibuprofen for supportive treatment.   Follow-up: Encouraged patient to follow-up with their provider if symptoms persist for 7 days. The patient was urged to return to the Emergency Department immediately with worsening of current symptoms, worsening abdominal pain, persistent vomiting, blood noted in stools, fever, or any other concerns. The patient verbalized understanding.   Patient verbalized understanding and agreed with plan.       MDM Rules/Calculators/A&P                         Patient with abdominal pain, suggestive of gastritis. Vitals are stable, no fever. Labs normal. Imaging not indicated today. No signs of dehydration, patient is tolerating PO's. Lungs are clear and no signs suggestive of PNA. Low concern for appendicitis, cholecystitis, pancreatitis, ruptured viscus, UTI, kidney stone, aortic dissection, aortic aneurysm or other emergent abdominal etiology. Supportive therapy indicated with return if symptoms worsen.      Final Clinical Impression(s) / ED Diagnoses Final diagnoses:  Acute gastritis  without hemorrhage, unspecified gastritis type    Rx / DC Orders ED Discharge Orders          Ordered    omeprazole (PRILOSEC) 20 MG capsule  Daily        03/20/21 1348    famotidine (PEPCID) 20 MG tablet  2 times daily        03/20/21 1348    sucralfate (CARAFATE) 1 g tablet  3 times daily with meals & bedtime        03/20/21 1348             Carlisle Cater, PA-C 03/20/21 1350    Hayden Rasmussen, MD 03/20/21 1744

## 2021-08-19 ENCOUNTER — Encounter (HOSPITAL_BASED_OUTPATIENT_CLINIC_OR_DEPARTMENT_OTHER): Payer: Self-pay

## 2021-08-19 ENCOUNTER — Emergency Department (HOSPITAL_BASED_OUTPATIENT_CLINIC_OR_DEPARTMENT_OTHER)
Admission: EM | Admit: 2021-08-19 | Discharge: 2021-08-19 | Disposition: A | Discharge status: Home / Self Care | Payer: BLUE CROSS/BLUE SHIELD | Attending: Emergency Medicine | Admitting: Emergency Medicine

## 2021-08-19 ENCOUNTER — Other Ambulatory Visit: Payer: Self-pay

## 2021-08-19 DIAGNOSIS — K2920 Alcoholic gastritis without bleeding: Secondary | ICD-10-CM | POA: Insufficient documentation

## 2021-08-19 DIAGNOSIS — R630 Anorexia: Secondary | ICD-10-CM | POA: Insufficient documentation

## 2021-08-19 LAB — COMPREHENSIVE METABOLIC PANEL
ALT: 20 U/L (ref 0–44)
AST: 29 U/L (ref 15–41)
Albumin: 4.4 g/dL (ref 3.5–5.0)
Alkaline Phosphatase: 102 U/L (ref 38–126)
Anion gap: 8 (ref 5–15)
BUN: 7 mg/dL (ref 6–20)
CO2: 26 mmol/L (ref 22–32)
Calcium: 9.3 mg/dL (ref 8.9–10.3)
Chloride: 102 mmol/L (ref 98–111)
Creatinine, Ser: 0.92 mg/dL (ref 0.61–1.24)
GFR, Estimated: >60 mL/min (ref >60)
Glucose, Bld: 98 mg/dL (ref 70–99)
Potassium: 3.1 mmol/L — ABNORMAL LOW (ref 3.5–5.1)
Sodium: 136 mmol/L (ref 135–145)
Total Bilirubin: 0.6 mg/dL (ref 0.3–1.2)
Total Protein: 8.4 g/dL — ABNORMAL HIGH (ref 6.5–8.1)

## 2021-08-19 LAB — TROPONIN I (HIGH SENSITIVITY)
Troponin I (High Sensitivity): 3 ng/L (ref <18)
Troponin I (High Sensitivity): 3 ng/L (ref <18)

## 2021-08-19 LAB — CBC WITH DIFFERENTIAL/PLATELET
Abs Immature Granulocytes: 0.02 10*3/uL (ref 0.00–0.07)
Basophils Absolute: 0.1 10*3/uL (ref 0.0–0.1)
Basophils Relative: 1 %
Eosinophils Absolute: 0.3 10*3/uL (ref 0.0–0.5)
Eosinophils Relative: 4 %
HCT: 47.5 % (ref 39.0–52.0)
Hemoglobin: 16.1 g/dL (ref 13.0–17.0)
Immature Granulocytes: 0 %
Lymphocytes Relative: 21 %
Lymphs Abs: 1.4 10*3/uL (ref 0.7–4.0)
MCH: 28 pg (ref 26.0–34.0)
MCHC: 33.9 g/dL (ref 30.0–36.0)
MCV: 82.8 fL (ref 80.0–100.0)
Monocytes Absolute: 0.5 10*3/uL (ref 0.1–1.0)
Monocytes Relative: 8 %
Neutro Abs: 4.3 10*3/uL (ref 1.7–7.7)
Neutrophils Relative %: 66 %
Platelets: 268 10*3/uL (ref 150–400)
RBC: 5.74 MIL/uL (ref 4.22–5.81)
RDW: 13.1 % (ref 11.5–15.5)
WBC: 6.6 10*3/uL (ref 4.0–10.5)
nRBC: 0 % (ref 0.0–0.2)

## 2021-08-19 LAB — URINALYSIS, ROUTINE W REFLEX MICROSCOPIC
Bilirubin Urine: NEGATIVE
Glucose, UA: NEGATIVE mg/dL
Hgb urine dipstick: NEGATIVE
Ketones, ur: NEGATIVE mg/dL
Leukocytes,Ua: NEGATIVE
Nitrite: NEGATIVE
Protein, ur: NEGATIVE mg/dL
Specific Gravity, Urine: 1.01 (ref 1.005–1.030)
pH: 7 (ref 5.0–8.0)

## 2021-08-19 LAB — RAPID URINE DRUG SCREEN, HOSP PERFORMED
Amphetamines: NOT DETECTED
Barbiturates: NOT DETECTED
Benzodiazepines: NOT DETECTED
Cocaine: NOT DETECTED
Opiates: NOT DETECTED
Tetrahydrocannabinol: POSITIVE — AB

## 2021-08-19 LAB — CBG MONITORING, ED: Glucose-Capillary: 97 mg/dL (ref 70–99)

## 2021-08-19 LAB — LIPASE, BLOOD: Lipase: 30 U/L (ref 11–51)

## 2021-08-19 MED ORDER — FAMOTIDINE 20 MG PO TABS: 20.0000 mg | ORAL_TABLET | Freq: Two times a day (BID) | ORAL | 0 refills | Status: AC

## 2021-08-19 MED ORDER — FAMOTIDINE IN NACL 20-0.9 MG/50ML-% IV SOLN
20.0000 mg | Freq: Once | INTRAVENOUS | Status: AC
  Administered 2021-08-19: 20 mg via INTRAVENOUS
  Filled 2021-08-19: qty 50

## 2021-08-19 MED ORDER — SUCRALFATE 1 GM/10ML PO SUSP
1.0000 g | Freq: Once | ORAL | Status: AC
Start: 2021-08-19 — End: 2021-08-19
  Administered 2021-08-19: 1 g via ORAL
  Filled 2021-08-19: qty 10

## 2021-08-19 MED ORDER — ONDANSETRON HCL 4 MG/2ML IJ SOLN
4.0000 mg | Freq: Once | INTRAMUSCULAR | Status: AC
  Administered 2021-08-19: 4 mg via INTRAVENOUS
  Filled 2021-08-19: qty 2

## 2021-08-19 MED ORDER — SUCRALFATE 1 G PO TABS: 1.0000 g | ORAL_TABLET | Freq: Three times a day (TID) | ORAL | 0 refills | Status: AC

## 2021-08-19 MED ORDER — ONDANSETRON 4 MG PO TBDP: 4.0000 mg | ORAL_TABLET | Freq: Three times a day (TID) | ORAL | 0 refills | Status: AC | PRN

## 2021-08-19 NOTE — ED Provider Notes (Signed)
[facility] EM[facility] DEPART[facility] Provider Note   CSN: [id number] Arrival date & time: 08/19/21  1243     History  Chief Complaint  Patient presents with   Abdominal Pain    Antonio Simmons is a 26 y.o. male.  Pt is a 26 yo male with pmh of IBS and alcohol induced gastritis presenting for complaints of nausea and epigastric abdominal pain. Pt admits to epigastric abdominal pain, non radiating, with nausea without vomiting, decreased appetite, and diarrhea x 4 days. Admits drinking Saturday and Sunday before symptom onset. States he was admitted for a week for alcoholic gastritis last year but had stopped drinking. Celebrated this weekend with him family.    Abdominal Pain Associated symptoms: diarrhea and nausea   Associated symptoms: no chest pain, no chills, no cough, no dysuria, no fever, no hematuria, no shortness of breath, no sore throat and no vomiting       Home Medications Prior to Admission medications   Medication Sig Start Date End Date Taking? Authorizing Provider  famotidine (PEPCID) 20 MG tablet Take 1 tablet (20 mg total) by mouth 2 (two) times daily. [id number]  Yes Campbell Stall P, DO  sucralfate (CARAFATE) 1 g tablet Take 1 tablet (1 g total) by mouth 4 (four) times daily -  with meals and at bedtime for 7 days. 08/19/21 0000000 Yes Campbell Stall P, DO  albuterol (PROAIR HFA) 108 (90 Base) MCG/ACT inhaler Inhale 2 puffs into the lungs every 4 (four) hours as needed for wheezing or shortness of breath.    [provider]  cetirizine (ZYRTEC) 10 MG tablet Take 10 mg by mouth daily.    [provider]  EPINEPHrine (EPIPEN 2-PAK) 0.3 mg/0.3 mL IJ SOAJ injection Inject 0.3 mg into the muscle once.    [provider]  famotidine (PEPCID) 20 MG tablet Take 1 tablet (20 mg total) by mouth 2 (two) times daily. 03/20/21   Carlisle Cater, PA-C  mometasone (NASONEX) 50 MCG/ACT nasal spray Place 1 spray into the nose daily.     [provider]  mometasone-formoterol (DULERA) 200-5 MCG/ACT AERO Inhale 2 puffs into the lungs 2 (two) times daily as needed for wheezing.    [provider]  omeprazole (PRILOSEC) 20 MG capsule Take 1 capsule (20 mg total) by mouth daily. 03/20/21   Carlisle Cater, PA-C  sucralfate (CARAFATE) 1 g tablet Take 1 tablet (1 g total) by mouth 4 (four) times daily -  with meals and at bedtime. 03/20/21   Carlisle Cater, PA-C      Allergies    Ceftin [cefuroxime axetil] and Pecan nut (diagnostic)    Review of Systems   Review of Systems  Constitutional:  Negative for chills and fever.  HENT:  Negative for ear pain and sore throat.   Eyes:  Negative for pain and visual disturbance.  Respiratory:  Negative for cough and shortness of breath.   Cardiovascular:  Negative for chest pain and palpitations.  Gastrointestinal:  Positive for abdominal pain, diarrhea and nausea. Negative for vomiting.  Genitourinary:  Negative for dysuria and hematuria.  Musculoskeletal:  Negative for arthralgias and back pain.  Skin:  Negative for color change and rash.  Neurological:  Negative for seizures and syncope.  All other systems reviewed and are negative.  Physical Exam Updated Vital Signs BP 132/84   Pulse (!) 58   Temp 98.5 F (36.9 C) (Oral)   Resp 17   Ht 5\' 10"  (1.778 m)   Wt  113.4 kg   SpO2 99%   BMI 35.87 kg/m  Physical Exam Vitals and nursing note reviewed.  Constitutional:      General: He is not in acute distress.    Appearance: He is well-developed.  HENT:     Head: Normocephalic and atraumatic.  Eyes:     Conjunctiva/sclera: Conjunctivae normal.  Cardiovascular:     Rate and Rhythm: Normal rate and regular rhythm.     Heart sounds: No murmur heard. Pulmonary:     Effort: Pulmonary effort is normal. No respiratory distress.     Breath sounds: Normal breath sounds.  Abdominal:     Palpations: Abdomen is soft.     Tenderness: There is no abdominal tenderness.   Musculoskeletal:        General: No swelling.     Cervical back: Neck supple.  Skin:    General: Skin is warm and dry.     Capillary Refill: Capillary refill takes less than 2 seconds.  Neurological:     Mental Status: He is alert.  Psychiatric:        Mood and Affect: Mood normal.    ED Results / Procedures / Treatments   Labs (all labs ordered are listed, but only abnormal results are displayed) Labs Reviewed  COMPREHENSIVE METABOLIC PANEL - Abnormal; Notable for the following components:      Result Value   Potassium 3.1 (*)    Total Protein 8.4 (*)    All other components within normal limits  RAPID URINE DRUG SCREEN, HOSP PERFORMED - Abnormal; Notable for the following components:   Tetrahydrocannabinol POSITIVE (*)    All other components within normal limits  CBC WITH DIFFERENTIAL/PLATELET  LIPASE, BLOOD  URINALYSIS, ROUTINE W REFLEX MICROSCOPIC  CBG MONITORING, ED  CBG MONITORING, ED  TROPONIN I (HIGH SENSITIVITY)  TROPONIN I (HIGH SENSITIVITY)    EKG EKG Interpretation  Date/Time:  Thursday Aug 19 2021 13:03:57 EDT Ventricular Rate:  82 PR Interval:  158 QRS Duration: 82 QT Interval:  366 QTC Calculation: 427 R Axis:   113 Text Interpretation: Normal sinus rhythm Right axis deviation Cannot rule out Anterior infarct , age undetermined T wave abnormality, consider inferior ischemia Abnormal ECG No previous ECGs available Confirmed by Campbell Stall ([id number]) on [id number] 1:36:55 PM  Radiology No results found.  Procedures Procedures    Medications Ordered in ED Medications  famotidine (PEPCID) IVPB 20 mg premix (has no administration in time range)  sucralfate (CARAFATE) 1 GM/10ML suspension 1 g (has no administration in time range)  ondansetron (ZOFRAN) injection 4 mg (has no administration in time range)    ED Course/ Medical Decision Making/ A&P                           Medical Decision Making Amount and/or Complexity of Data Reviewed Labs:  ordered.  Risk Prescription drug management.   Patient is a 26 year old male with past medical history of IBS and alcoholic gastritis presenting for complaints of nausea, without vomiting and epigastric abdominal pain that occurred after weekend of drinking.  Stable vitals  No signs or symptoms of sepsis  Epigastric tenderness on exam.  No right lower quadrant pain.  Doubt appendicitis.  Liver profile, lipase, and renal function stable.  Patient given Maalox and Carafate for symptomatic treatment of alcoholic gastritis.  Mended for close follow-up with patient's established gastroenterologist symptoms do not resolve in the next 3 to 5 days.  Prescription sent to  pharmacy.  Patient recommended to stop drinking at this time.  Patient in no distress and overall condition improved here in the ED. Detailed discussions were had with the patient regarding current findings, and need for close f/u with PCP or on call doctor. The patient has been instructed to return immediately if the symptoms worsen in any way for re-evaluation. Patient verbalized understanding and is in agreement with current care plan. All questions answered prior to discharge.         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

## 2021-08-19 NOTE — ED Triage Notes (Signed)
Patient arrives POV c/o abdominal pain that started on Monday. Pt reports "having some drinks" and became intoxicated on Wednesday and Saturday. Pt reports abd pain to be epigastric "and can sometimes feel it in his chest." Pt denies cardiac hx; pt states he had diarrhea and now has constipation. Pt also reports having IBS.

## 2021-08-19 NOTE — ED Notes (Signed)
Presents with diffuse abd pain, denies abd tenderness, states having loose stool. Last Saturday night was drinking heavy, states approx 10-12 shots of vodka was consumed. BS x 4 no, no periumbilical brusing is currently noted. IV established and blood tubes drawn. Pt on cont cardiac monitor due to pain also being epigastric

## 2022-10-20 ENCOUNTER — Encounter (HOSPITAL_BASED_OUTPATIENT_CLINIC_OR_DEPARTMENT_OTHER): Payer: Self-pay | Admitting: Emergency Medicine

## 2022-10-20 ENCOUNTER — Emergency Department (HOSPITAL_BASED_OUTPATIENT_CLINIC_OR_DEPARTMENT_OTHER)
Admission: EM | Admit: 2022-10-20 | Discharge: 2022-10-20 | Disposition: A | Discharge status: Home / Self Care | Payer: Medicaid Other | Attending: Emergency Medicine | Admitting: Emergency Medicine

## 2022-10-20 ENCOUNTER — Other Ambulatory Visit: Payer: Self-pay

## 2022-10-20 DIAGNOSIS — T40715A Adverse effect of cannabis, initial encounter: Secondary | ICD-10-CM | POA: Diagnosis present

## 2022-10-20 DIAGNOSIS — Z91148 Patient's other noncompliance with medication regimen for other reason: Secondary | ICD-10-CM | POA: Diagnosis not present

## 2022-10-20 DIAGNOSIS — F419 Anxiety disorder, unspecified: Secondary | ICD-10-CM | POA: Insufficient documentation

## 2022-10-20 DIAGNOSIS — F209 Schizophrenia, unspecified: Secondary | ICD-10-CM | POA: Insufficient documentation

## 2022-10-20 MED ORDER — LORAZEPAM 1 MG PO TABS
1.0000 mg | ORAL_TABLET | Freq: Once | ORAL | Status: AC
  Administered 2022-10-20: 1 mg via ORAL
  Filled 2022-10-20: qty 1

## 2022-10-20 NOTE — Discharge Instructions (Signed)
As we discussed, I suspect your symptoms are due to an adverse reaction to smoking marijuana.  I recommend avoiding this particular batch in the future.  I also recommend that you restart your psychiatric medication and follow-up closely with your primary doctor.  Return if development of any new or worsening symptoms.

## 2022-10-20 NOTE — ED Notes (Signed)
Reviewed discharge instructions and recommendations with pt. Pt states feeling better and feels more comfortable

## 2022-10-20 NOTE — ED Triage Notes (Signed)
Pt presents to ED POV. Pt c/o anxiety since tues. Pt reports that he smoked marijuana on tues and has been paranoid and anxious since then. Pt reports not taking schizophrenia medicine since feb. Took it today.

## 2022-10-20 NOTE — ED Provider Notes (Signed)
Antonio Simmons Provider Note   CSN: [id number] Arrival date & time: 10/20/22  1305     History  Chief Complaint  Patient presents with   Anxiety    Antonio Simmons is a 27 y.o. male.  Patient with history of asthma, IBS, schizophrenia presents today with complaints of anxiety.  He states that same began on Tuesday after he smoked marijuana.  He states that the marijuana he got was from a different dealer than he normally goes to.  He states that it smelled differently than normal.  His girlfriend who is present in the room states that she smoked it as well and has not had any adverse effects. Patient states that he has had increased paranoia and decreased appetite since then that has been persistent. He states that he has not been taking his medication for schizophrenia in the past several months as he states that he feels like marijuana controls his symptoms better than his medication does.  He denies any auditory or visual hallucinations, no SI or HI. Denies any fevers, chills, chest pain, shortness of breath, cough, congestion. Does note 1 episode of vomiting after smoking but none since. No abdominal pain. Is having regular bowel movements.  The history is provided by the patient. No language interpreter was used.  Anxiety       Home Medications Prior to Admission medications   Medication Sig Start Date End Date Taking? Authorizing Provider  albuterol (PROAIR HFA) 108 (90 Base) MCG/ACT inhaler Inhale 2 puffs into the lungs every 4 (four) hours as needed for wheezing or shortness of breath.    [provider]  cetirizine (ZYRTEC) 10 MG tablet Take 10 mg by mouth daily.    [provider]  EPINEPHrine (EPIPEN 2-PAK) 0.3 mg/0.3 mL IJ SOAJ injection Inject 0.3 mg into the muscle once.    [provider]  famotidine (PEPCID) 20 MG tablet Take 1 tablet (20 mg total) by mouth 2 (two) times daily. 03/20/21    Renne Crigler, PA-C  famotidine (PEPCID) 20 MG tablet Take 1 tablet (20 mg total) by mouth 2 (two) times daily. 08/19/21   Edwin Dada P, DO  mometasone (NASONEX) 50 MCG/ACT nasal spray Place 1 spray into the nose daily.    [provider]  mometasone-formoterol (DULERA) 200-5 MCG/ACT AERO Inhale 2 puffs into the lungs 2 (two) times daily as needed for wheezing.    [provider]  omeprazole (PRILOSEC) 20 MG capsule Take 1 capsule (20 mg total) by mouth daily. 03/20/21   Renne Crigler, PA-C  ondansetron (ZOFRAN-ODT) 4 MG disintegrating tablet Take 1 tablet (4 mg total) by mouth every 8 (eight) hours as needed for nausea or vomiting. 08/19/21   Alvira Monday, MD  sucralfate (CARAFATE) 1 g tablet Take 1 tablet (1 g total) by mouth 4 (four) times daily -  with meals and at bedtime. 03/20/21   Renne Crigler, PA-C  sucralfate (CARAFATE) 1 g tablet Take 1 tablet (1 g total) by mouth 4 (four) times daily -  with meals and at bedtime for 7 days. 08/19/21 08/26/21  Franne Forts, DO      Allergies    Ceftin [cefuroxime axetil] and Pecan nut (diagnostic)    Review of Systems   Review of Systems  All other systems reviewed and are negative.   Physical Exam Updated Vital Signs BP 118/80 (BP Location: Right Arm)   Pulse 88   Temp 99.8 F (37.7 C) (  Oral)   Resp 18   SpO2 98%  Physical Exam Vitals and nursing note reviewed.  Constitutional:      General: He is not in acute distress.    Appearance: Normal appearance. He is normal weight. He is not ill-appearing, toxic-appearing or diaphoretic.  HENT:     Head: Normocephalic and atraumatic.  Cardiovascular:     Rate and Rhythm: Normal rate and regular rhythm.     Heart sounds: Normal heart sounds.  Pulmonary:     Effort: Pulmonary effort is normal. No respiratory distress.     Breath sounds: Normal breath sounds.  Abdominal:     General: Abdomen is flat.     Palpations: Abdomen is soft.     Tenderness: There is no  abdominal tenderness.  Musculoskeletal:        General: Normal range of motion.     Cervical back: Normal range of motion.  Skin:    General: Skin is warm and dry.  Neurological:     General: No focal deficit present.     Mental Status: He is alert and oriented to person, place, and time.  Psychiatric:        Mood and Affect: Mood normal.        Behavior: Behavior normal.     Comments: Patient is calm and cooperative, speaking in complete sentences.  Does not appear to be responding to internal stimuli.     ED Results / Procedures / Treatments   Labs (all labs ordered are listed, but only abnormal results are displayed) Labs Reviewed - No data to display  EKG None  Radiology No results found.  Procedures Procedures    Medications Ordered in ED Medications  LORazepam (ATIVAN) tablet 1 mg (has no administration in time range)    ED Course/ Medical Decision Making/ A&P                             Medical Decision Making Risk Prescription drug management.   This patient is a 27 y.o. male who presents to the ED for concern of adverse reaction to marijuana.   Differential diagnoses prior to evaluation: Anixety, psychosis, adverse reaction to marijuana  Past Medical History / Social History / Additional history: Chart reviewed. Pertinent results include: history of asthma, IBS, schizophrenia  Physical Exam: Physical exam performed. The pertinent findings include: Per above, no pertinent physical exam findings  Medications / Treatment: Patient given Ativan for anxiety/paranoia with improvement   Disposition: After consideration of the diagnostic results and the patients response to treatment, I feel that emergency department workup does not suggest an emergent condition requiring admission or immediate intervention beyond what has been performed at this time. The plan is: discharge with close outpatient follow-up. Recommend adherence with home psych meds as well.  No indication for additional evaluation with labs or imaging. Discussed with patient who is understanding and in agreement. Suspect patients symptoms are due to adverse reaction to the marijuana he smoked.  I have discussed avoiding smoking any more of this marijuana in the future. Patient is not currently psychotic and is speaking in complete sentences and is reasonable without SI/HI or AVH. Evaluation and diagnostic testing in the emergency department does not suggest an emergent condition requiring admission or immediate intervention beyond what has been performed at this time.  Plan for discharge with close PCP follow-up.  Patient is understanding and amenable with plan, educated on red flag symptoms that  would prompt immediate return.  Patient discharged in stable condition.   Final Clinical Impression(s) / ED Diagnoses Final diagnoses:  Adverse effect of cannabis, initial encounter    Rx / DC Orders ED Discharge Orders     None     An After Visit Summary was printed and given to the patient.     Vear Clock 10/20/22 1508    Virgina Norfolk, DO 10/21/22 1402

## 2022-11-19 ENCOUNTER — Other Ambulatory Visit: Payer: Self-pay

## 2022-11-19 ENCOUNTER — Emergency Department (HOSPITAL_BASED_OUTPATIENT_CLINIC_OR_DEPARTMENT_OTHER)
Admission: EM | Admit: 2022-11-19 | Discharge: 2022-11-19 | Disposition: A | Discharge status: Home / Self Care | Payer: Medicaid Other | Attending: Emergency Medicine | Admitting: Emergency Medicine

## 2022-11-19 ENCOUNTER — Emergency Department (HOSPITAL_BASED_OUTPATIENT_CLINIC_OR_DEPARTMENT_OTHER): Payer: Medicaid Other

## 2022-11-19 ENCOUNTER — Encounter (HOSPITAL_BASED_OUTPATIENT_CLINIC_OR_DEPARTMENT_OTHER): Payer: Self-pay

## 2022-11-19 DIAGNOSIS — M545 Low back pain, unspecified: Secondary | ICD-10-CM | POA: Diagnosis present

## 2022-11-19 DIAGNOSIS — K5900 Constipation, unspecified: Secondary | ICD-10-CM | POA: Diagnosis not present

## 2022-11-19 LAB — URINALYSIS, ROUTINE W REFLEX MICROSCOPIC
Bilirubin Urine: NEGATIVE
Glucose, UA: NEGATIVE mg/dL
Hgb urine dipstick: NEGATIVE
Ketones, ur: NEGATIVE mg/dL
Leukocytes,Ua: NEGATIVE
Nitrite: NEGATIVE
Protein, ur: NEGATIVE mg/dL
Specific Gravity, Urine: 1.015 (ref 1.005–1.030)
pH: 6 (ref 5.0–8.0)

## 2022-11-19 LAB — COMPREHENSIVE METABOLIC PANEL
ALT: 16 U/L (ref 0–44)
AST: 22 U/L (ref 15–41)
Albumin: 4.4 g/dL (ref 3.5–5.0)
Alkaline Phosphatase: 114 U/L (ref 38–126)
Anion gap: 11 (ref 5–15)
BUN: 10 mg/dL (ref 6–20)
CO2: 25 mmol/L (ref 22–32)
Calcium: 9.2 mg/dL (ref 8.9–10.3)
Chloride: 102 mmol/L (ref 98–111)
Creatinine, Ser: 0.94 mg/dL (ref 0.61–1.24)
GFR, Estimated: >60 mL/min (ref >60)
Glucose, Bld: 95 mg/dL (ref 70–99)
Potassium: 3.6 mmol/L (ref 3.5–5.1)
Sodium: 138 mmol/L (ref 135–145)
Total Bilirubin: 0.5 mg/dL (ref 0.3–1.2)
Total Protein: 8.1 g/dL (ref 6.5–8.1)

## 2022-11-19 LAB — LIPASE, BLOOD: Lipase: 28 U/L (ref 11–51)

## 2022-11-19 LAB — CBC
HCT: 44.8 % (ref 39.0–52.0)
Hemoglobin: 14.7 g/dL (ref 13.0–17.0)
MCH: 27.4 pg (ref 26.0–34.0)
MCHC: 32.8 g/dL (ref 30.0–36.0)
MCV: 83.4 fL (ref 80.0–100.0)
Platelets: 270 10*3/uL (ref 150–400)
RBC: 5.37 MIL/uL (ref 4.22–5.81)
RDW: 13.2 % (ref 11.5–15.5)
WBC: 8 10*3/uL (ref 4.0–10.5)
nRBC: 0 % (ref 0.0–0.2)

## 2022-11-19 MED ORDER — IBUPROFEN 600 MG PO TABS: 600.0000 mg | ORAL_TABLET | Freq: Four times a day (QID) | ORAL | 0 refills | Status: AC | PRN

## 2022-11-19 MED ORDER — METHOCARBAMOL 500 MG PO TABS: 500.0000 mg | ORAL_TABLET | Freq: Two times a day (BID) | ORAL | 0 refills | Status: AC | PRN

## 2022-11-19 MED ORDER — SODIUM CHLORIDE 0.9 % IV BOLUS
1000.0000 mL | Freq: Once | INTRAVENOUS | Status: AC
  Administered 2022-11-19: 1000 mL via INTRAVENOUS

## 2022-11-19 MED ORDER — ONDANSETRON HCL 4 MG/2ML IJ SOLN
4.0000 mg | Freq: Once | INTRAMUSCULAR | Status: AC
  Administered 2022-11-19: 4 mg via INTRAVENOUS
  Filled 2022-11-19: qty 2

## 2022-11-19 MED ORDER — CYCLOBENZAPRINE HCL 10 MG PO TABS
10.0000 mg | ORAL_TABLET | Freq: Once | ORAL | Status: AC
  Administered 2022-11-19: 10 mg via ORAL
  Filled 2022-11-19: qty 1

## 2022-11-19 MED ORDER — KETOROLAC TROMETHAMINE 15 MG/ML IJ SOLN
15.0000 mg | Freq: Once | INTRAMUSCULAR | Status: AC
  Administered 2022-11-19: 15 mg via INTRAVENOUS
  Filled 2022-11-19: qty 1

## 2022-11-19 MED ORDER — POLYETHYLENE GLYCOL 3350 17 GM/SCOOP PO POWD: 1.0000 | Freq: Once | ORAL | 0 refills | Status: AC

## 2022-11-19 NOTE — ED Triage Notes (Signed)
The patient stated he has been constipated for 4 days. He stated he took a laxative yesterday but only had a small amount of hard stool. He stated how he is having back in his back.

## 2022-11-19 NOTE — ED Provider Notes (Signed)
[place] [facility] Provider Note   CSN: [id number] Arrival date & time: 11/19/22  1040     History  Chief Complaint  Patient presents with   Constipation   Back Pain    Antonio Simmons is a 27 y.o. male.   Constipation Associated symptoms: back pain   Back Pain   27 year old male presents emergency department with complaints of constipation as well as back pain.  Patient reports increased feelings of constipation over the past 4 days.  States that he took a laxative and a very small hard bowel movement yesterday.  States he has a history of IBS and has dealt with constipation intermittently but is currently on no medications for his IBS.  Reports some abdominal pain 4 days ago but currently without abdominal pain.  States that he noticed abrupt onset right-sided low back pain/flank pain yesterday when he was sitting "playing games" on his couch.  States the pain is worsened with movement.  Denies any saddle anesthesia, bladder dysfunction, weakness/sensory deficit in the lower extremities, history of IV drug use, prolonged corticosteroid use, fever.  Denies any dysuria, hematuria, vomiting, chest pain, shortness of breath.  Past medical history significant for IBS, GERD, asthma  Home Medications Prior to Admission medications   Medication Sig Start Date End Date Taking? Authorizing Provider  ibuprofen (ADVIL) 600 MG tablet Take 1 tablet (600 mg total) by mouth every 6 (six) hours as needed. 11/19/22  Yes Sherian Maroon A, PA  methocarbamol (ROBAXIN) 500 MG tablet Take 1 tablet (500 mg total) by mouth 2 (two) times daily as needed for muscle spasms. 11/19/22  Yes Sherian Maroon A, PA  polyethylene glycol powder (GLYCOLAX/MIRALAX) 17 GM/SCOOP powder Take 255 g by mouth once for 1 dose. 11/19/22 11/19/22 Yes Sherian Maroon A, PA  albuterol (PROAIR HFA) 108 (90 Base) MCG/ACT inhaler Inhale 2 puffs into the lungs every 4 (four) hours as  needed for wheezing or shortness of breath.    [provider]  cetirizine (ZYRTEC) 10 MG tablet Take 10 mg by mouth daily.    [provider]  EPINEPHrine (EPIPEN 2-PAK) 0.3 mg/0.3 mL IJ SOAJ injection Inject 0.3 mg into the muscle once.    [provider]  famotidine (PEPCID) 20 MG tablet Take 1 tablet (20 mg total) by mouth 2 (two) times daily. 03/20/21   Renne Crigler, PA-C  famotidine (PEPCID) 20 MG tablet Take 1 tablet (20 mg total) by mouth 2 (two) times daily. 08/19/21   Edwin Dada P, DO  mometasone (NASONEX) 50 MCG/ACT nasal spray Place 1 spray into the nose daily.    [provider]  mometasone-formoterol (DULERA) 200-5 MCG/ACT AERO Inhale 2 puffs into the lungs 2 (two) times daily as needed for wheezing.    [provider]  omeprazole (PRILOSEC) 20 MG capsule Take 1 capsule (20 mg total) by mouth daily. 03/20/21   Renne Crigler, PA-C  ondansetron (ZOFRAN-ODT) 4 MG disintegrating tablet Take 1 tablet (4 mg total) by mouth every 8 (eight) hours as needed for nausea or vomiting. 08/19/21   Alvira Monday, MD  sucralfate (CARAFATE) 1 g tablet Take 1 tablet (1 g total) by mouth 4 (four) times daily -  with meals and at bedtime. 03/20/21   Renne Crigler, PA-C  sucralfate (CARAFATE) 1 g tablet Take 1 tablet (1 g total) by mouth 4 (four) times daily -  with meals and at bedtime for 7 days. 08/19/21 08/26/21  Franne Forts, DO  Allergies    Ceftin [cefuroxime axetil] and Pecan nut (diagnostic)    Review of Systems   Review of Systems  Gastrointestinal:  Positive for constipation.  Musculoskeletal:  Positive for back pain.  All other systems reviewed and are negative.   Physical Exam Updated Vital Signs BP (!) 115/92   Pulse 71   Temp 98.1 F (36.7 C) (Oral)   Resp 18   Ht 5\' 10"  (1.778 m)   Wt 113 kg   SpO2 99%   BMI 35.74 kg/m  Physical Exam Vitals and nursing note reviewed.  Constitutional:      General: He is not in acute  distress.    Appearance: He is well-developed.  HENT:     Head: Normocephalic and atraumatic.  Eyes:     Conjunctiva/sclera: Conjunctivae normal.  Cardiovascular:     Rate and Rhythm: Normal rate and regular rhythm.     Heart sounds: No murmur heard. Pulmonary:     Effort: Pulmonary effort is normal. No respiratory distress.     Breath sounds: Normal breath sounds. No wheezing, rhonchi or rales.  Abdominal:     Palpations: Abdomen is soft.     Tenderness: There is no abdominal tenderness. There is no guarding.  Musculoskeletal:        General: No swelling.     Cervical back: Neck supple.     Comments: No midline tenderness of cervical, thoracic, lumbar spine with no step-off or deformity.  Right-sided lumbar paraspinal tenderness to palpation versus CVA tenderness on the right side.  Muscular strength 5 out of 5 lower extremities.  No sensory deficits on major nerve distributions of lower extremities.  DTR symmetric at patella.  Pedal pulses 2+ bilaterally.  Skin:    General: Skin is warm and dry.     Capillary Refill: Capillary refill takes less than 2 seconds.  Neurological:     Mental Status: He is alert.  Psychiatric:        Mood and Affect: Mood normal.     ED Results / Procedures / Treatments   Labs (all labs ordered are listed, but only abnormal results are displayed) Labs Reviewed  COMPREHENSIVE METABOLIC PANEL  CBC  LIPASE, BLOOD  URINALYSIS, ROUTINE W REFLEX MICROSCOPIC    EKG None  Radiology CT Renal Stone Study  Result Date: 11/19/2022 CLINICAL DATA:  Abdominal/flank pain. Lower back pain and constipation for 5 days. EXAM: CT ABDOMEN AND PELVIS WITHOUT CONTRAST TECHNIQUE: Multidetector CT imaging of the abdomen and pelvis was performed following the standard protocol without IV contrast. RADIATION DOSE REDUCTION: This exam was performed according to the departmental dose-optimization program which includes automated exposure control, adjustment of the mA  and/or kV according to patient size and/or use of iterative reconstruction technique. COMPARISON:  CT abdomen and pelvis dated 09/22/2020. FINDINGS: Lower chest: No acute abnormality. Hepatobiliary: No focal liver abnormality is seen. Status post cholecystectomy. No biliary dilatation. Pancreas: Unremarkable. No pancreatic ductal dilatation or surrounding inflammatory changes. Spleen: Normal in size without focal abnormality. Adrenals/Urinary Tract: Adrenal glands are unremarkable. Kidneys are normal, without renal calculi, focal lesion, or hydronephrosis. Bladder is unremarkable. Stomach/Bowel: Stomach is within normal limits. Appendix appears normal. No evidence of bowel wall thickening, distention, or inflammatory changes. Vascular/Lymphatic: No significant vascular findings are present. No enlarged abdominal or pelvic lymph nodes. Reproductive: Prostate is unremarkable. Other: No abdominal wall hernia or abnormality. No abdominopelvic ascites. Musculoskeletal: No acute or significant osseous findings. IMPRESSION: No acute findings in the abdomen or pelvis. Electronically  Signed   By: Romona Curls M.D.   On: 11/19/2022 12:28    Procedures Procedures    Medications Ordered in ED Medications  ketorolac (TORADOL) 15 MG/ML injection 15 mg (15 mg Intravenous Given 11/19/22 1203)  sodium chloride 0.9 % bolus 1,000 mL ( Intravenous Stopped 11/19/22 1303)  ondansetron (ZOFRAN) injection 4 mg (4 mg Intravenous Given 11/19/22 1202)  cyclobenzaprine (FLEXERIL) tablet 10 mg (10 mg Oral Given 11/19/22 1315)    ED Course/ Medical Decision Making/ A&P                                 Medical Decision Making Amount and/or Complexity of Data Reviewed Labs: ordered. Radiology: ordered.  Risk Prescription drug management.   This patient presents to the ED for concern of constipation/back pain, this involves an extensive number of treatment options, and is a complaint that carries with it a high risk of  complications and morbidity.  The differential diagnosis includes cauda equina, AAA, spinal epidural abscess, pyelonephritis, nephrolithiasis, strain/pain, dislocation, fracture, SBO/LBO, volvulus, constipation, fecal impaction, ileus   Co morbidities that complicate the patient evaluation  See HPI   Additional history obtained:  Additional history obtained from EMR External records from outside source obtained and reviewed including hospital records   Lab Tests:  I Ordered, and personally interpreted labs.  The pertinent results include: No leukocytosis.  No evidence of anemia.  Platelets within range.  No electrolyte abnormalities.  No renal dysfunction.  No transaminitis.  UA without abnormality.  Lipase within normal limits.   Imaging Studies ordered:  I ordered imaging studies including CT renal stone study I independently visualized and interpreted imaging which showed no abnormality I agree with the radiologist interpretation   Cardiac Monitoring: / EKG:  The patient was maintained on a cardiac monitor.  I personally viewed and interpreted the cardiac monitored which showed an underlying rhythm of: Sinus rhythm   Consultations Obtained:  N/a   Problem List / ED Course / Critical interventions / Medication management  Constipation I ordered medication including Toradol, 1 L normal saline, Zofran  Reevaluation of the patient after these medicines showed that the patient improved I have reviewed the patients home medicines and have made adjustments as needed   Social Determinants of Health:  Denies tobacco, licit drug use   Test / Admission - Considered:  Constipation, low back pain Vitals signs  within normal range and stable throughout visit. Laboratory/imaging studies significant for: See above 27 year old male presents emergency department with 4-day history of constipation despite having bowel movements with administration of laxatives while at home.  On  exam, patient without any abdominal tenderness or appreciable abdominal distention.  Patient with bowel movement yesterday so still able to produce stool.  Patient CT without evidence of significant stool burden but with stool in colon.  Regarding patient's back pain, pain reproducible on right lower lumbar region with appreciable spasming when compared to contralateral side.  Patient without red flag signs for back pain on HPI/physical exam besides possible constipation.  Regarding constipation, patient still able to produce stool independently and does not seem to bowel function.  Seems to be unrelated to back pain.  Back pain occurred when he experienced twisting type motion when he was sitting playing video games.  Seem to be more musculoskeletal in etiology.  Given acute onset duration of patient's symptoms as well as possible CVA tenderness, laboratory and imaging studies were  obtained.  No evidence of kidney stone, pyelonephritis or other abnormality appreciable on CT imaging for patient's right-sided low back pain/flank pain.  Suspect patient's symptoms are more muscular in etiology.  Will treat with NSAIDs as well as muscle laxer as needed in the outpatient setting.  Regarding constipation, will recommend adequate oral hydration, MiraLAX and fiber supplementation.  Will recommend close follow-up with primary care in the outpatient setting.  Treatment plan discussed at length with patient and he acknowledged understanding was agreeable to said plan.  Patient over well-appearing, afebrile in no acute distress. Worrisome signs and symptoms were discussed with the patient, and the patient acknowledged understanding to return to the ED if noticed. Patient was stable upon discharge.          Final Clinical Impression(s) / ED Diagnoses Final diagnoses:  Acute right-sided low back pain without sciatica  Constipation, unspecified constipation type    Rx / DC Orders ED Discharge Orders           Ordered    polyethylene glycol powder (GLYCOLAX/MIRALAX) 17 GM/SCOOP powder   Once        11/19/22 1234    ibuprofen (ADVIL) 600 MG tablet  Every 6 hours PRN        11/19/22 1234    methocarbamol (ROBAXIN) 500 MG tablet  2 times daily PRN        11/19/22 1234              Peter Garter, [place] 11/19/22 1337    Rondel Baton, MD 11/19/22 1907

## 2022-11-19 NOTE — Discharge Instructions (Addendum)
As discussed, workup today overall reassuring.  Laboratory studies all appear normal.  CT scan of your abdomen did not show signs of kidney stone or other acute/emergent abnormality.  Regarding feelings of constipation, will prescribe medication called polyethylene glycol.  Recommend beginning with 2-4 capfuls and decreasing if you develop right diarrhea or increasing if still experiencing constipation.  Recommend continued oral hydration as we discussed and over-the-counter fiber supplementation.   In regards to your back pain, we will send in high-dose NSAID for treatment of pain as well as muscle laxer to use as needed.  Note the muscle laxer can cause drowsiness so please do not drive or perform any high risk activity until you realize this medications effects on you. Recommend follow-up with your primary care provider for reevaluation of your symptoms.  Please do not hesitate to return to the emergency department for worrisome signs and symptoms we discussed become apparent.

## 2023-04-29 ENCOUNTER — Other Ambulatory Visit: Payer: Self-pay

## 2023-04-29 ENCOUNTER — Emergency Department (HOSPITAL_BASED_OUTPATIENT_CLINIC_OR_DEPARTMENT_OTHER)
Admission: EM | Admit: 2023-04-29 | Discharge: 2023-04-29 | Disposition: A | Discharge status: Home / Self Care | Payer: MEDICAID | Attending: Emergency Medicine | Admitting: Emergency Medicine

## 2023-04-29 DIAGNOSIS — R09A2 Foreign body sensation, throat: Secondary | ICD-10-CM | POA: Diagnosis present

## 2023-04-29 LAB — BASIC METABOLIC PANEL
Anion gap: 11 (ref 5–15)
BUN: 7 mg/dL (ref 6–20)
CO2: 24 mmol/L (ref 22–32)
Calcium: 9.3 mg/dL (ref 8.9–10.3)
Chloride: 99 mmol/L (ref 98–111)
Creatinine, Ser: 0.95 mg/dL (ref 0.61–1.24)
GFR, Estimated: >60 mL/min (ref >60)
Glucose, Bld: 94 mg/dL (ref 70–99)
Potassium: 3.6 mmol/L (ref 3.5–5.1)
Sodium: 134 mmol/L — ABNORMAL LOW (ref 135–145)

## 2023-04-29 LAB — CBC WITH DIFFERENTIAL/PLATELET
Abs Immature Granulocytes: 0.01 10*3/uL (ref 0.00–0.07)
Basophils Absolute: 0.1 10*3/uL (ref 0.0–0.1)
Basophils Relative: 1 %
Eosinophils Absolute: 0.2 10*3/uL (ref 0.0–0.5)
Eosinophils Relative: 4 %
HCT: 45.4 % (ref 39.0–52.0)
Hemoglobin: 15.4 g/dL (ref 13.0–17.0)
Immature Granulocytes: 0 %
Lymphocytes Relative: 22 %
Lymphs Abs: 1.3 10*3/uL (ref 0.7–4.0)
MCH: 27.9 pg (ref 26.0–34.0)
MCHC: 33.9 g/dL (ref 30.0–36.0)
MCV: 82.2 fL (ref 80.0–100.0)
Monocytes Absolute: 0.5 10*3/uL (ref 0.1–1.0)
Monocytes Relative: 8 %
Neutro Abs: 3.8 10*3/uL (ref 1.7–7.7)
Neutrophils Relative %: 65 %
Platelets: 272 10*3/uL (ref 150–400)
RBC: 5.52 MIL/uL (ref 4.22–5.81)
RDW: 12.6 % (ref 11.5–15.5)
WBC: 5.7 10*3/uL (ref 4.0–10.5)
nRBC: 0 % (ref 0.0–0.2)

## 2023-04-29 MED ORDER — DIPHENHYDRAMINE HCL 50 MG/ML IJ SOLN
25.0000 mg | Freq: Once | INTRAMUSCULAR | Status: AC
  Administered 2023-04-29: 25 mg via INTRAVENOUS
  Filled 2023-04-29: qty 1

## 2023-04-29 MED ORDER — METOCLOPRAMIDE HCL 5 MG/ML IJ SOLN
10.0000 mg | Freq: Once | INTRAMUSCULAR | Status: AC
  Administered 2023-04-29: 10 mg via INTRAVENOUS
  Filled 2023-04-29: qty 2

## 2023-04-29 MED ORDER — ALUM & MAG HYDROXIDE-SIMETH 200-200-20 MG/5ML PO SUSP
30.0000 mL | Freq: Once | ORAL | Status: AC
  Administered 2023-04-29: 30 mL via ORAL
  Filled 2023-04-29: qty 30

## 2023-04-29 MED ORDER — LIDOCAINE VISCOUS HCL 2 % MT SOLN
15.0000 mL | Freq: Once | OROMUCOSAL | Status: AC
  Administered 2023-04-29: 15 mL via OROMUCOSAL
  Filled 2023-04-29: qty 15

## 2023-04-29 MED ORDER — GLUCAGON HCL RDNA (DIAGNOSTIC) 1 MG IJ SOLR
1.0000 mg | Freq: Once | INTRAMUSCULAR | Status: AC
  Administered 2023-04-29: 1 mg via INTRAVENOUS
  Filled 2023-04-29: qty 1

## 2023-04-29 NOTE — Discharge Instructions (Signed)
As we discussed, you likely have swallowed the peanut.  See your doctor for follow-up  Return to ER if you have trouble swallowing, drooling, trouble breathing

## 2023-04-29 NOTE — ED Triage Notes (Signed)
Was eating peanuts last night and felt like it got stuck in his throat.  Patient is breathing and speaking in full sentences. NAD.

## 2023-04-29 NOTE — ED Provider Notes (Signed)
[place] [facility] Provider Note   CSN: [id number] Arrival date & time: 04/29/23  1530     History  Chief Complaint  Patient presents with   Foreign Body    Antonio Simmons is a 28 y.o. male here presenting with possible globus sensation.  Patient states that he was eating a bar with peanuts.  He states that he accidentally choked on the peanut.  He felt that the peanut is still in his throat.  He states that he is able to tolerate his secretion but had increased secretion overall.  Denies any stridor.  The history is provided by the patient.       Home Medications Prior to Admission medications   Medication Sig Start Date End Date Taking? Authorizing Provider  albuterol (PROAIR HFA) 108 (90 Base) MCG/ACT inhaler Inhale 2 puffs into the lungs every 4 (four) hours as needed for wheezing or shortness of breath.    [provider]  cetirizine (ZYRTEC) 10 MG tablet Take 10 mg by mouth daily.    [provider]  EPINEPHrine (EPIPEN 2-PAK) 0.3 mg/0.3 mL IJ SOAJ injection Inject 0.3 mg into the muscle once.    [provider]  famotidine (PEPCID) 20 MG tablet Take 1 tablet (20 mg total) by mouth 2 (two) times daily. 03/20/21   Renne Crigler, PA-C  famotidine (PEPCID) 20 MG tablet Take 1 tablet (20 mg total) by mouth 2 (two) times daily. 08/19/21   Edwin Dada P, DO  ibuprofen (ADVIL) 600 MG tablet Take 1 tablet (600 mg total) by mouth every 6 (six) hours as needed. 11/19/22   Peter Garter, PA  methocarbamol (ROBAXIN) 500 MG tablet Take 1 tablet (500 mg total) by mouth 2 (two) times daily as needed for muscle spasms. 11/19/22   Sherian Maroon A, PA  mometasone (NASONEX) 50 MCG/ACT nasal spray Place 1 spray into the nose daily.    [provider]  mometasone-formoterol (DULERA) 200-5 MCG/ACT AERO Inhale 2 puffs into the lungs 2 (two) times daily as needed for wheezing.    [provider]   omeprazole (PRILOSEC) 20 MG capsule Take 1 capsule (20 mg total) by mouth daily. 03/20/21   Renne Crigler, PA-C  ondansetron (ZOFRAN-ODT) 4 MG disintegrating tablet Take 1 tablet (4 mg total) by mouth every 8 (eight) hours as needed for nausea or vomiting. 08/19/21   Alvira Monday, MD  sucralfate (CARAFATE) 1 g tablet Take 1 tablet (1 g total) by mouth 4 (four) times daily -  with meals and at bedtime. 03/20/21   Renne Crigler, PA-C  sucralfate (CARAFATE) 1 g tablet Take 1 tablet (1 g total) by mouth 4 (four) times daily -  with meals and at bedtime for 7 days. 08/19/21 08/26/21  Franne Forts, DO      Allergies    Ceftin [cefuroxime axetil] and Pecan nut (diagnostic)    Review of Systems   Review of Systems  HENT:  Positive for trouble swallowing.   All other systems reviewed and are negative.   Physical Exam Updated Vital Signs BP 133/82 (BP Location: Left Arm)   Pulse (!) 103   Temp 98.4 F (36.9 C) (Oral)   Resp 18   Ht 5\' 10"  (1.778 m)   Wt 117 kg   SpO2 100%   BMI 37.02 kg/m  Physical Exam Vitals and nursing note reviewed.  Constitutional:      Appearance: Normal appearance.  HENT:  Head: Normocephalic.     Nose: Nose normal.     Mouth/Throat:     Mouth: Mucous membranes are moist.     Comments: No foreign body visualized in the throat  Eyes:     Extraocular Movements: Extraocular movements intact.     Pupils: Pupils are equal, round, and reactive to light.  Neck:     Comments: No stridor Cardiovascular:     Rate and Rhythm: Normal rate and regular rhythm.     Pulses: Normal pulses.     Heart sounds: Normal heart sounds.  Pulmonary:     Effort: Pulmonary effort is normal.     Breath sounds: Normal breath sounds.  Abdominal:     General: Abdomen is flat.     Palpations: Abdomen is soft.  Musculoskeletal:        General: Normal range of motion.     Cervical back: Normal range of motion.  Skin:    General: Skin is warm.     Capillary Refill:  Capillary refill takes less than 2 seconds.  Neurological:     General: No focal deficit present.     Mental Status: He is alert and oriented to person, place, and time.  Psychiatric:        Mood and Affect: Mood normal.        Behavior: Behavior normal.     ED Results / Procedures / Treatments   Labs (all labs ordered are listed, but only abnormal results are displayed) Labs Reviewed  CBC WITH DIFFERENTIAL/PLATELET  BASIC METABOLIC PANEL    EKG None  Radiology No results found.  Procedures Procedures    Medications Ordered in ED Medications  glucagon (human recombinant) (GLUCAGEN) injection 1 mg (has no administration in time range)  metoCLOPramide (REGLAN) injection 10 mg (has no administration in time range)  diphenhydrAMINE (BENADRYL) injection 25 mg (has no administration in time range)  lidocaine (XYLOCAINE) 2 % viscous mouth solution 15 mL (has no administration in time range)  alum & mag hydroxide-simeth (MAALOX/MYLANTA) 200-200-20 MG/5ML suspension 30 mL (has no administration in time range)    ED Course/ Medical Decision Making/ A&P                                 Medical Decision Making Antonio Simmons is a 28 y.o. male here presenting with possible globus sensation.  Patient felt that a peanut got stuck in his throat.  No stridor.  Patient is tolerating secretions.  Will try Reglan Benadryl and glucagon and GI cocktail and reassess.   4:48 PM Patient able to tolerate p.o. now.  I think likely globus sensation or he may have swallowed the peanut.  At this point he is stable for discharge  Problems Addressed: Globus sensation: acute illness or injury  Amount and/or Complexity of Data Reviewed Labs: ordered. Decision-making details documented in ED Course.  Risk OTC drugs. Prescription drug management.    Final Clinical Impression(s) / ED Diagnoses Final diagnoses:  None    Rx / DC Orders ED Discharge Orders     None          Charlynne Pander, MD 04/29/23 1650

## 2023-04-29 NOTE — ED Notes (Signed)
RT Note: Patient had no respiratory distress noted. Breath sounds appear clear and he is speaking in full sentences with no issues

## 2023-05-01 ENCOUNTER — Emergency Department (HOSPITAL_BASED_OUTPATIENT_CLINIC_OR_DEPARTMENT_OTHER)
Admission: EM | Admit: 2023-05-01 | Discharge: 2023-05-01 | Disposition: A | Discharge status: Home / Self Care | Payer: MEDICAID | Attending: Emergency Medicine | Admitting: Emergency Medicine

## 2023-05-01 ENCOUNTER — Other Ambulatory Visit: Payer: Self-pay

## 2023-05-01 ENCOUNTER — Encounter (HOSPITAL_BASED_OUTPATIENT_CLINIC_OR_DEPARTMENT_OTHER): Payer: Self-pay | Admitting: Emergency Medicine

## 2023-05-01 ENCOUNTER — Emergency Department (HOSPITAL_BASED_OUTPATIENT_CLINIC_OR_DEPARTMENT_OTHER): Payer: MEDICAID

## 2023-05-01 DIAGNOSIS — R000 Tachycardia, unspecified: Secondary | ICD-10-CM | POA: Insufficient documentation

## 2023-05-01 DIAGNOSIS — Z20822 Contact with and (suspected) exposure to covid-19: Secondary | ICD-10-CM | POA: Insufficient documentation

## 2023-05-01 DIAGNOSIS — R197 Diarrhea, unspecified: Secondary | ICD-10-CM | POA: Insufficient documentation

## 2023-05-01 DIAGNOSIS — R051 Acute cough: Secondary | ICD-10-CM | POA: Diagnosis not present

## 2023-05-01 DIAGNOSIS — R1084 Generalized abdominal pain: Secondary | ICD-10-CM | POA: Diagnosis not present

## 2023-05-01 DIAGNOSIS — R059 Cough, unspecified: Secondary | ICD-10-CM | POA: Diagnosis present

## 2023-05-01 DIAGNOSIS — R112 Nausea with vomiting, unspecified: Secondary | ICD-10-CM | POA: Insufficient documentation

## 2023-05-01 LAB — RESP PANEL BY RT-PCR (RSV, FLU A&B, COVID)  RVPGX2
Influenza A by PCR: NEGATIVE
Influenza B by PCR: NEGATIVE
Resp Syncytial Virus by PCR: NEGATIVE
SARS Coronavirus 2 by RT PCR: NEGATIVE

## 2023-05-01 LAB — CBC
HCT: 46.1 % (ref 39.0–52.0)
Hemoglobin: 15.3 g/dL (ref 13.0–17.0)
MCH: 27.5 pg (ref 26.0–34.0)
MCHC: 33.2 g/dL (ref 30.0–36.0)
MCV: 82.8 fL (ref 80.0–100.0)
Platelets: 307 10*3/uL (ref 150–400)
RBC: 5.57 MIL/uL (ref 4.22–5.81)
RDW: 12.7 % (ref 11.5–15.5)
WBC: 6.8 10*3/uL (ref 4.0–10.5)
nRBC: 0 % (ref 0.0–0.2)

## 2023-05-01 LAB — BASIC METABOLIC PANEL
Anion gap: 10 (ref 5–15)
BUN: 7 mg/dL (ref 6–20)
CO2: 24 mmol/L (ref 22–32)
Calcium: 9.5 mg/dL (ref 8.9–10.3)
Chloride: 102 mmol/L (ref 98–111)
Creatinine, Ser: 0.97 mg/dL (ref 0.61–1.24)
GFR, Estimated: >60 mL/min (ref >60)
Glucose, Bld: 130 mg/dL — ABNORMAL HIGH (ref 70–99)
Potassium: 3.3 mmol/L — ABNORMAL LOW (ref 3.5–5.1)
Sodium: 136 mmol/L (ref 135–145)

## 2023-05-01 LAB — TROPONIN I (HIGH SENSITIVITY): Troponin I (High Sensitivity): 2 ng/L (ref <18)

## 2023-05-01 LAB — D-DIMER, QUANTITATIVE: D-Dimer, Quant: <0.27 ug{FEU}/mL (ref 0.00–0.50)

## 2023-05-01 LAB — GROUP A STREP BY PCR: Group A Strep by PCR: NOT DETECTED

## 2023-05-01 MED ORDER — LOPERAMIDE HCL 2 MG PO CAPS: 2.0000 mg | ORAL_CAPSULE | Freq: Four times a day (QID) | ORAL | 0 refills | Status: AC | PRN

## 2023-05-01 MED ORDER — ONDANSETRON HCL 4 MG PO TABS: 4.0000 mg | ORAL_TABLET | Freq: Four times a day (QID) | ORAL | 0 refills | Status: AC

## 2023-05-01 MED ORDER — POTASSIUM CHLORIDE CRYS ER 20 MEQ PO TBCR
40.0000 meq | EXTENDED_RELEASE_TABLET | Freq: Once | ORAL | Status: AC
  Administered 2023-05-01: 40 meq via ORAL
  Filled 2023-05-01: qty 2

## 2023-05-01 MED ORDER — NAPROXEN 500 MG PO TABS: 500.0000 mg | ORAL_TABLET | Freq: Two times a day (BID) | ORAL | 0 refills | Status: AC

## 2023-05-01 MED ORDER — ACETAMINOPHEN 325 MG PO TABS
650.0000 mg | ORAL_TABLET | Freq: Once | ORAL | Status: AC | PRN
  Administered 2023-05-01: 650 mg via ORAL
  Filled 2023-05-01: qty 2

## 2023-05-01 MED ORDER — AZITHROMYCIN 250 MG PO TABS: 250.0000 mg | ORAL_TABLET | Freq: Every day | ORAL | 0 refills | Status: AC

## 2023-05-01 NOTE — ED Triage Notes (Signed)
Pt POV steady gait- c/o chills, sore throat, nausea, cough x3 days. Denies fevers.   Tolerating secretions. Once asked about pain, pt reports 10/10 pain in chest.

## 2023-05-01 NOTE — ED Provider Notes (Signed)
[place] [facility] Provider Note   CSN: [id number] Arrival date & time: 05/01/23  1226     History  Chief Complaint  Patient presents with   Sore Throat   Chest Pain    Antonio Simmons is a 28 y.o. male here for evaluation of feeling unwell.  States this started 3 days ago.  He was seen in the ED 2 days ago after he was eating a bar which contained peanuts.  He developed a sore throat afterwards and thought he possibly had a peanut that was lodged in his throat.  He states since then he has had cough, nausea, sore throat, fever, chills, generalized abdominal pain.  Shaking chills yesterday.  He is also decreased appetite.  He is able to tolerate p.o. intake however states he has to force himself to eat.  He states yesterday when he had the chills he had heart palpitations and his heart was racing. No HA, emesis, bloody stool, neck pain. No meds PTA. No sick contacts.  HPI     Home Medications Prior to Admission medications   Medication Sig Start Date End Date Taking? Authorizing Provider  azithromycin (ZITHROMAX) 250 MG tablet Take 1 tablet (250 mg total) by mouth daily. Take first 2 tablets together, then 1 every day until finished. 05/01/23  Yes Gale Klar A, PA-C  loperamide (IMODIUM) 2 MG capsule Take 1 capsule (2 mg total) by mouth 4 (four) times daily as needed for diarrhea or loose stools. 05/01/23  Yes Zeus Marquis A, PA-C  naproxen (NAPROSYN) 500 MG tablet Take 1 tablet (500 mg total) by mouth 2 (two) times daily. 05/01/23  Yes Simone Tuckey A, PA-C  ondansetron (ZOFRAN) 4 MG tablet Take 1 tablet (4 mg total) by mouth every 6 (six) hours. 05/01/23  Yes Tenasia Aull A, PA-C  albuterol (PROAIR HFA) 108 (90 Base) MCG/ACT inhaler Inhale 2 puffs into the lungs every 4 (four) hours as needed for wheezing or shortness of breath.    [provider]  cetirizine (ZYRTEC) 10 MG tablet Take 10 mg by mouth daily.     [provider]  EPINEPHrine (EPIPEN 2-PAK) 0.3 mg/0.3 mL IJ SOAJ injection Inject 0.3 mg into the muscle once.    [provider]  famotidine (PEPCID) 20 MG tablet Take 1 tablet (20 mg total) by mouth 2 (two) times daily. 03/20/21   Renne Crigler, PA-C  famotidine (PEPCID) 20 MG tablet Take 1 tablet (20 mg total) by mouth 2 (two) times daily. 08/19/21   Edwin Dada P, DO  ibuprofen (ADVIL) 600 MG tablet Take 1 tablet (600 mg total) by mouth every 6 (six) hours as needed. 11/19/22   Peter Garter, PA  methocarbamol (ROBAXIN) 500 MG tablet Take 1 tablet (500 mg total) by mouth 2 (two) times daily as needed for muscle spasms. 11/19/22   Sherian Maroon A, PA  mometasone (NASONEX) 50 MCG/ACT nasal spray Place 1 spray into the nose daily.    [provider]  mometasone-formoterol (DULERA) 200-5 MCG/ACT AERO Inhale 2 puffs into the lungs 2 (two) times daily as needed for wheezing.    [provider]  omeprazole (PRILOSEC) 20 MG capsule Take 1 capsule (20 mg total) by mouth daily. 03/20/21   Renne Crigler, PA-C  ondansetron (ZOFRAN-ODT) 4 MG disintegrating tablet Take 1 tablet (4 mg total) by mouth every 8 (eight) hours as needed for nausea or vomiting. 08/19/21   Alvira Monday, MD  sucralfate (CARAFATE) 1  g tablet Take 1 tablet (1 g total) by mouth 4 (four) times daily -  with meals and at bedtime. 03/20/21   Renne Crigler, PA-C  sucralfate (CARAFATE) 1 g tablet Take 1 tablet (1 g total) by mouth 4 (four) times daily -  with meals and at bedtime for 7 days. 08/19/21 08/26/21  Franne Forts, DO      Allergies    Ceftin [cefuroxime axetil] and Pecan nut (diagnostic)    Review of Systems   Review of Systems  Constitutional:  Positive for activity change, appetite change, fatigue and fever.  HENT:  Positive for congestion, rhinorrhea and sore throat. Negative for drooling, ear discharge, ear pain, facial swelling, nosebleeds, postnasal drip, sinus pressure, sinus  pain, sneezing, trouble swallowing and voice change.   Respiratory:  Positive for cough and shortness of breath. Negative for apnea, choking and chest tightness.   Cardiovascular:  Positive for chest pain.  Gastrointestinal:  Positive for abdominal pain and nausea. Negative for abdominal distention, blood in stool, constipation and vomiting.  Genitourinary: Negative.   Musculoskeletal:  Positive for neck pain. Negative for arthralgias, back pain, gait problem, joint swelling, myalgias and neck stiffness.  Skin: Negative.   Neurological: Negative.   All other systems reviewed and are negative.   Physical Exam Updated Vital Signs BP 123/63   Pulse 78   Temp 100.3 F (37.9 C)   Resp 16   Ht 5\' 10"  (1.778 m)   Wt 117 kg   SpO2 100%   BMI 37.02 kg/m  Physical Exam Vitals and nursing note reviewed.  Constitutional:      General: He is not in acute distress.    Appearance: He is well-developed. He is not ill-appearing, toxic-appearing or diaphoretic.  HENT:     Head: Normocephalic and atraumatic.     Nose: Congestion and rhinorrhea present.     Mouth/Throat:     Mouth: Mucous membranes are moist. No oral lesions.     Pharynx: Uvula midline. Posterior oropharyngeal erythema present. No pharyngeal swelling, oropharyngeal exudate or uvula swelling.     Tonsils: No tonsillar exudate or tonsillar abscesses. 1+ on the right. 1+ on the left.     Comments: PO clear, no pooling of secretions. Uvula midline. Tonsils 1 + BIL without exudate Eyes:     Pupils: Pupils are equal, round, and reactive to light.  Cardiovascular:     Rate and Rhythm: Regular rhythm. Tachycardia present.     Heart sounds: Normal heart sounds.     Comments: HR 103 Pulmonary:     Effort: Pulmonary effort is normal. No respiratory distress.     Breath sounds: Normal breath sounds.  Abdominal:     General: Bowel sounds are normal. There is no distension.     Palpations: Abdomen is soft.     Tenderness: There is no  abdominal tenderness. There is no rebound.  Musculoskeletal:        General: Normal range of motion.     Cervical back: Normal range of motion and neck supple.  Skin:    General: Skin is warm and dry.     Capillary Refill: Capillary refill takes less than 2 seconds.  Neurological:     General: No focal deficit present.     Mental Status: He is alert and oriented to person, place, and time.     ED Results / Procedures / Treatments   Labs (all labs ordered are listed, but only abnormal results are displayed) Labs Reviewed  BASIC METABOLIC PANEL - Abnormal; Notable for the following components:      Result Value   Potassium 3.3 (*)    Glucose, Bld 130 (*)    All other components within normal limits  GROUP A STREP BY PCR  RESP PANEL BY RT-PCR (RSV, FLU A&B, COVID)  [id number]  CBC  D-DIMER, QUANTITATIVE  TROPONIN I (HIGH SENSITIVITY)    EKG EKG Interpretation Date/Time:  Monday May 01 2023 12:49:09 EST Ventricular Rate:  115 PR Interval:  122 QRS Duration:  86 QT Interval:  491 QTC Calculation: 680 R Axis:   116  Text Interpretation: Sinus tachycardia Right atrial enlargement Right axis deviation Nonspecific T abnormalities, lateral leads Borderline ST elevation, anterolateral leads Prolonged QT interval Confirmed by Alona Bene [phone]) on 05/01/2023 1:02:25 PM  Radiology DG Chest 2 View Result Date: 05/01/2023 CLINICAL DATA:  Chest pain, sore throat, cough and nausea. EXAM: CHEST - 2 VIEW COMPARISON:  09/22/2020 FINDINGS: The heart size and mediastinal contours are within normal limits. Both lungs are clear. The visualized skeletal structures are unremarkable. IMPRESSION: No active cardiopulmonary disease. Electronically Signed   By: Signa Kell M.D.   On: 05/01/2023 13:17    Procedures Procedures    Medications Ordered in ED Medications  acetaminophen (TYLENOL) tablet 650 mg (650 mg Oral Given 05/01/23 1255)  potassium chloride SA (KLOR-CON M) CR tablet 40 mEq  (40 mEq Oral Given 05/01/23 1515)    ED Course/ Medical Decision Making/ A&P   28 year old here for evaluation of URI symptoms.  Patient with 3 to 4 days of cough, sore throat, chills, nausea, diarrhea, myalgias.  Of note he was seen 2 days ago for possible food bolus after eating a peanut.  He has been able to eat at home arise decreased appetite.  On arrival he has a temp of 100.3.  This is some chest tightness, worse with cough he has no clinical evidence of VTE on exam.  Plan on labs imaging and reassess  Labs and imaging personally viewed interpreted:  COVID, flu, RSV negative CBC without leukocytosis metabolic panel potassium 3.3, supplemented Strep negative Troponin 2 D-dimer less than 0.27 Chest x-rays have cardiomegaly, pulmonary, pneumothorax EKG without ischemic change initially showed significantly lengthened prolonged QT however repeat normal limits  Discussed results with patient.  Will treat for possible CAP, patient states his symptoms actually started prior to his recent visit with the pain management so low suspicion for aspiration PNA as cause of his fever.  Do not feel he needs CT imaging or additional labs at this time.  He is starting p.o. intake here.  He is hemodynamically stable.  Low suspicion for sepsis, ACS, PE, dissection, meningitis, mass, cholelithiasis, obstruction, perforation, pneumomediastinum, x-ray infectious diarrhea  The patient has been appropriately medically screened and/or stabilized in the ED. I have low suspicion for any other emergent medical condition which would require further screening, evaluation or treatment in the ED or require inpatient management.  Patient is hemodynamically stable and in no acute distress.  Patient able to ambulate in department prior to ED.  Evaluation does not show acute pathology that would require ongoing or additional emergent interventions while in the emergency department or further inpatient treatment.  I have  discussed the diagnosis with the patient and answered all questions.  Pain is been managed while in the emergency department and patient has no further complaints prior to discharge.  Patient is comfortable with plan discussed in room and is stable for discharge at this  time.  I have discussed strict return precautions for returning to the emergency department.  Patient was encouraged to follow-up with PCP/specialist refer to at discharge.                                Medical Decision Making Amount and/or Complexity of Data Reviewed Independent Historian: friend External Data Reviewed: labs, radiology, ECG and notes. Labs: ordered. Decision-making details documented in ED Course. Radiology: ordered and independent interpretation performed. Decision-making details documented in ED Course. ECG/medicine tests: ordered and independent interpretation performed. Decision-making details documented in ED Course.  Risk OTC drugs. Prescription drug management. Decision regarding hospitalization. Diagnosis or treatment significantly limited by social determinants of health.           Final Clinical Impression(s) / ED Diagnoses Final diagnoses:  Acute cough  Nausea vomiting and diarrhea    Rx / DC Orders ED Discharge Orders          Ordered    azithromycin (ZITHROMAX) 250 MG tablet  Daily        05/01/23 1617    ondansetron (ZOFRAN) 4 MG tablet  Every 6 hours        05/01/23 1617    naproxen (NAPROSYN) 500 MG tablet  2 times daily        05/01/23 1617    loperamide (IMODIUM) 2 MG capsule  4 times daily PRN        05/01/23 1617              Chinaza Rooke A, PA-C 05/01/23 1909    Maia Plan, MD 05/05/23 1549

## 2023-05-01 NOTE — Discharge Instructions (Addendum)
Azithromycin is the antibiotic Zofran is a nausea medication Naproxen is a pain reliever, do not take additional ibuprofen when taking this medication Imodium is an antidiarrhea  Follow up outpatient. Return for new or worsening symptom

## 2023-05-17 ENCOUNTER — Other Ambulatory Visit: Payer: Self-pay

## 2023-05-17 ENCOUNTER — Emergency Department (HOSPITAL_BASED_OUTPATIENT_CLINIC_OR_DEPARTMENT_OTHER): Payer: MEDICAID

## 2023-05-17 ENCOUNTER — Emergency Department (HOSPITAL_BASED_OUTPATIENT_CLINIC_OR_DEPARTMENT_OTHER)
Admission: EM | Admit: 2023-05-17 | Discharge: 2023-05-17 | Disposition: A | Discharge status: Home / Self Care | Payer: MEDICAID | Attending: Emergency Medicine | Admitting: Emergency Medicine

## 2023-05-17 ENCOUNTER — Encounter (HOSPITAL_BASED_OUTPATIENT_CLINIC_OR_DEPARTMENT_OTHER): Payer: Self-pay | Admitting: Radiology

## 2023-05-17 DIAGNOSIS — R000 Tachycardia, unspecified: Secondary | ICD-10-CM | POA: Diagnosis not present

## 2023-05-17 DIAGNOSIS — J45909 Unspecified asthma, uncomplicated: Secondary | ICD-10-CM | POA: Insufficient documentation

## 2023-05-17 DIAGNOSIS — Z79899 Other long term (current) drug therapy: Secondary | ICD-10-CM | POA: Insufficient documentation

## 2023-05-17 DIAGNOSIS — Z91010 Allergy to peanuts: Secondary | ICD-10-CM | POA: Diagnosis not present

## 2023-05-17 DIAGNOSIS — Z7951 Long term (current) use of inhaled steroids: Secondary | ICD-10-CM | POA: Insufficient documentation

## 2023-05-17 DIAGNOSIS — U071 COVID-19: Secondary | ICD-10-CM | POA: Diagnosis not present

## 2023-05-17 DIAGNOSIS — R0789 Other chest pain: Secondary | ICD-10-CM | POA: Diagnosis present

## 2023-05-17 LAB — BASIC METABOLIC PANEL
Anion gap: 8 (ref 5–15)
BUN: 7 mg/dL (ref 6–20)
CO2: 25 mmol/L (ref 22–32)
Calcium: 9.3 mg/dL (ref 8.9–10.3)
Chloride: 103 mmol/L (ref 98–111)
Creatinine, Ser: 0.94 mg/dL (ref 0.61–1.24)
GFR, Estimated: >60 mL/min (ref >60)
Glucose, Bld: 113 mg/dL — ABNORMAL HIGH (ref 70–99)
Potassium: 3.5 mmol/L (ref 3.5–5.1)
Sodium: 136 mmol/L (ref 135–145)

## 2023-05-17 LAB — RESP PANEL BY RT-PCR (RSV, FLU A&B, COVID)  RVPGX2
Influenza A by PCR: NEGATIVE
Influenza B by PCR: NEGATIVE
Resp Syncytial Virus by PCR: NEGATIVE
SARS Coronavirus 2 by RT PCR: POSITIVE — AB

## 2023-05-17 LAB — CBC
HCT: 47 % (ref 39.0–52.0)
Hemoglobin: 15.7 g/dL (ref 13.0–17.0)
MCH: 27.4 pg (ref 26.0–34.0)
MCHC: 33.4 g/dL (ref 30.0–36.0)
MCV: 82.2 fL (ref 80.0–100.0)
Platelets: 289 10*3/uL (ref 150–400)
RBC: 5.72 MIL/uL (ref 4.22–5.81)
RDW: 12.5 % (ref 11.5–15.5)
WBC: 4.4 10*3/uL (ref 4.0–10.5)
nRBC: 0 % (ref 0.0–0.2)

## 2023-05-17 LAB — TROPONIN I (HIGH SENSITIVITY): Troponin I (High Sensitivity): 2 ng/L (ref <18)

## 2023-05-17 MED ORDER — DEXAMETHASONE 4 MG PO TABS
10.0000 mg | ORAL_TABLET | Freq: Once | ORAL | Status: AC
  Administered 2023-05-17: 10 mg via ORAL
  Filled 2023-05-17: qty 3

## 2023-05-17 NOTE — ED Provider Notes (Signed)
[facility] Provider Note   CSN: [id number] Arrival date & time: 05/17/23  1528     History  Chief Complaint  Patient presents with   Chest Pain    Antonio Simmons is a 28 y.o. male.  Patient here with some chest discomfort congestion for the last few days.  Nothing makes it worse or better.  He does feel anxious.  Denies any weakness numbness tingling.  Has had a recent cardiac workup.  He is not symptomatic now.  He has some viral symptoms but no nausea vomiting diarrhea.  History of schizophrenia asthma IBS.  The history is provided by the patient.       Home Medications Prior to Admission medications   Medication Sig Start Date End Date Taking? Authorizing Provider  albuterol (PROAIR HFA) 108 (90 Base) MCG/ACT inhaler Inhale 2 puffs into the lungs every 4 (four) hours as needed for wheezing or shortness of breath.    [provider]  azithromycin (ZITHROMAX) 250 MG tablet Take 1 tablet (250 mg total) by mouth daily. Take first 2 tablets together, then 1 every day until finished. 05/01/23   Henderly, Britni A, PA-C  cetirizine (ZYRTEC) 10 MG tablet Take 10 mg by mouth daily.    [provider]  EPINEPHrine (EPIPEN 2-PAK) 0.3 mg/0.3 mL IJ SOAJ injection Inject 0.3 mg into the muscle once.    [provider]  famotidine (PEPCID) 20 MG tablet Take 1 tablet (20 mg total) by mouth 2 (two) times daily. 03/20/21   Renne Crigler, PA-C  famotidine (PEPCID) 20 MG tablet Take 1 tablet (20 mg total) by mouth 2 (two) times daily. 08/19/21   Edwin Dada P, DO  ibuprofen (ADVIL) 600 MG tablet Take 1 tablet (600 mg total) by mouth every 6 (six) hours as needed. 11/19/22   Peter Garter, PA  loperamide (IMODIUM) 2 MG capsule Take 1 capsule (2 mg total) by mouth 4 (four) times daily as needed for diarrhea or loose stools. 05/01/23   Henderly, Britni A, PA-C  methocarbamol (ROBAXIN) 500 MG tablet Take 1 tablet  (500 mg total) by mouth 2 (two) times daily as needed for muscle spasms. 11/19/22   Sherian Maroon A, PA  mometasone (NASONEX) 50 MCG/ACT nasal spray Place 1 spray into the nose daily.    [provider]  mometasone-formoterol (DULERA) 200-5 MCG/ACT AERO Inhale 2 puffs into the lungs 2 (two) times daily as needed for wheezing.    [provider]  naproxen (NAPROSYN) 500 MG tablet Take 1 tablet (500 mg total) by mouth 2 (two) times daily. 05/01/23   Henderly, Britni A, PA-C  omeprazole (PRILOSEC) 20 MG capsule Take 1 capsule (20 mg total) by mouth daily. 03/20/21   Renne Crigler, PA-C  ondansetron (ZOFRAN) 4 MG tablet Take 1 tablet (4 mg total) by mouth every 6 (six) hours. 05/01/23   Henderly, Britni A, PA-C  ondansetron (ZOFRAN-ODT) 4 MG disintegrating tablet Take 1 tablet (4 mg total) by mouth every 8 (eight) hours as needed for nausea or vomiting. 08/19/21   Alvira Monday, MD  sucralfate (CARAFATE) 1 g tablet Take 1 tablet (1 g total) by mouth 4 (four) times daily -  with meals and at bedtime. 03/20/21   Renne Crigler, PA-C  sucralfate (CARAFATE) 1 g tablet Take 1 tablet (1 g total) by mouth 4 (four) times daily -  with meals and at bedtime for 7 days. 08/19/21 08/26/21  Franne Forts, DO  Allergies    Ceftin [cefuroxime axetil] and Pecan nut (diagnostic)    Review of Systems   Review of Systems  Physical Exam Updated Vital Signs BP (!) 141/82   Pulse 84   Temp 99 F (37.2 C)   Resp 15   Ht 5\' 3"  (1.6 m)   Wt 117 kg   SpO2 97%   BMI 45.70 kg/m  Physical Exam Vitals and nursing note reviewed.  Constitutional:      General: He is not in acute distress.    Appearance: He is well-developed. He is not ill-appearing.  HENT:     Head: Normocephalic and atraumatic.  Eyes:     Extraocular Movements: Extraocular movements intact.     Conjunctiva/sclera: Conjunctivae normal.     Pupils: Pupils are equal, round, and reactive to light.  Cardiovascular:     Rate  and Rhythm: Normal rate and regular rhythm.     Pulses:          Radial pulses are 2+ on the left side.     Heart sounds: Normal heart sounds. No murmur heard. Pulmonary:     Effort: Pulmonary effort is normal. No respiratory distress.     Breath sounds: Normal breath sounds.  Abdominal:     Palpations: Abdomen is soft.     Tenderness: There is no abdominal tenderness.  Musculoskeletal:        General: No swelling.     Cervical back: Normal range of motion and neck supple.  Skin:    General: Skin is warm and dry.     Capillary Refill: Capillary refill takes less than 2 seconds.  Neurological:     Mental Status: He is alert.  Psychiatric:        Mood and Affect: Mood normal.     ED Results / Procedures / Treatments   Labs (all labs ordered are listed, but only abnormal results are displayed) Labs Reviewed  RESP PANEL BY RT-PCR (RSV, FLU A&B, COVID)  RVPGX2 - Abnormal; Notable for the following components:      Result Value   SARS Coronavirus 2 by RT PCR POSITIVE (*)    All other components within normal limits  BASIC METABOLIC PANEL - Abnormal; Notable for the following components:   Glucose, Bld 113 (*)    All other components within normal limits  CBC  TROPONIN I (HIGH SENSITIVITY)  TROPONIN I (HIGH SENSITIVITY)    EKG EKG Interpretation Date/Time:  Wednesday May 17 2023 16:06:27 EST Ventricular Rate:  103 PR Interval:  152 QRS Duration:  86 QT Interval:  306 QTC Calculation: 400 R Axis:   108  Text Interpretation: Sinus tachycardia When compared with ECG of 17-May-2023 15:57, PREVIOUS ECG IS PRESENT Confirmed by Virgina Norfolk [phone]) on 05/17/2023 4:09:16 PM  Radiology DG Chest 2 View Result Date: 05/17/2023 CLINICAL DATA:  Chest pain and tightness EXAM: CHEST - 2 VIEW COMPARISON:  05/01/2023 FINDINGS: The heart size and mediastinal contours are within normal limits. Both lungs are clear. The visualized skeletal structures are unremarkable. IMPRESSION: No  active cardiopulmonary disease. Electronically Signed   By: Judie Petit.  Shick M.D.   On: 05/17/2023 16:27    Procedures Procedures    Medications Ordered in ED Medications  dexamethasone (DECADRON) tablet 10 mg (10 mg Oral Given 05/17/23 1712)    ED Course/ Medical Decision Making/ A&P  Medical Decision Making Amount and/or Complexity of Data Reviewed Labs: ordered. Radiology: ordered.  Risk Prescription drug management.   Errik Zayaan Kozak is here with chest pain type symptoms.  Unremarkable vitals.  No fever.  EKG shows sinus rhythm.  No cardiac risk factors.  History of asthma.  Differential diagnosis likely anxiety related process versus infectious process versus less likely ACS.  Was evaluated couple weeks ago for the same.  Had negative troponin D-dimers.  I have no concern for PE or ACS but will check troponin and EKG.  He has no risk factors for either.  EKG shows sinus rhythm.  No ischemic changes.  Troponin normal.  Per my review interpretation of labs no significant anemia electrolyte abnormality kidney injury or leukocytosis.  Chest x-ray shows no evidence of pneumonia or pneumothorax.  Patient positive for COVID-19.  Will treat with Decadron.  Overall mildly symptomatic.  Well-appearing.  Discharged in good condition.  Understands return precautions.  This chart was dictated using voice recognition software.  Despite best efforts to proofread,  errors can occur which can change the documentation meaning.         Final Clinical Impression(s) / ED Diagnoses Final diagnoses:  COVID-19    Rx / DC Orders ED Discharge Orders     None         Virgina Norfolk, DO 05/17/23 1727

## 2023-05-17 NOTE — ED Triage Notes (Signed)
Pt states he began having chest tightness around last Thursday when he was getting over a sinus infection. Pt states that he feels like this pain is different than the chest pain he was having on the 27th. Pt states that he feels like the chest pain is going to his back. Pt unsure if this pain is anxiety related or not. Pt seems very anxious in triage. Pt does take medication for his anxiety but states he doesn't feel like it helps.

## 2023-05-17 NOTE — ED Notes (Signed)
D/c paperwork reviewed with pt, including follow up care.  All questions and/or concerns addressed at time of d/c.  No further needs expressed. . Pt verbalized understanding, Ambulatory with significant other to ED exit, NAD.
